# Patient Record
Sex: Male | Born: 1985
Health system: Southern US, Community
[De-identification: ages and names within clinical notes are randomized; demographics above are authoritative.]

## PROBLEM LIST (undated history)

## (undated) DIAGNOSIS — E785 Hyperlipidemia, unspecified: Secondary | ICD-10-CM

## (undated) DIAGNOSIS — R740 Nonspecific elevation of levels of transaminase and lactic acid dehydrogenase [LDH]: Principal | ICD-10-CM

## (undated) DIAGNOSIS — K824 Cholesterolosis of gallbladder: Secondary | ICD-10-CM

## (undated) DIAGNOSIS — R7401 Elevation of levels of liver transaminase levels: Secondary | ICD-10-CM

## (undated) HISTORY — DX: Elevation of levels of liver transaminase levels: R74.01

## (undated) HISTORY — DX: Cholesterolosis of gallbladder: K82.4

## (undated) HISTORY — DX: Nonspecific elevation of levels of transaminase and lactic acid dehydrogenase (ldh): R74.0

## (undated) HISTORY — PX: WISDOM TOOTH EXTRACTION: SHX21

## (undated) HISTORY — DX: Hyperlipidemia, unspecified: E78.5

---

## 2008-12-08 ENCOUNTER — Emergency Department (HOSPITAL_COMMUNITY): Admission: EM | Admit: 2008-12-08 | Discharge: 2008-12-08 | Payer: Self-pay | Admitting: Family Medicine

## 2015-11-27 ENCOUNTER — Telehealth: Payer: Self-pay | Admitting: *Deleted

## 2015-11-27 ENCOUNTER — Encounter: Payer: Self-pay | Admitting: *Deleted

## 2015-11-27 NOTE — Telephone Encounter (Signed)
Pre-Visit Call completed with patient and chart updated.   Pre-Visit Info documented in Specialty Comments under [employer].    

## 2015-11-27 NOTE — Telephone Encounter (Signed)
Unable to reach patient at time of pre-visit call. Left message for patient to return call when available.  

## 2015-11-28 ENCOUNTER — Encounter: Payer: Self-pay | Admitting: Physician Assistant

## 2015-11-28 ENCOUNTER — Ambulatory Visit (INDEPENDENT_AMBULATORY_CARE_PROVIDER_SITE_OTHER): Payer: BLUE CROSS/BLUE SHIELD | Admitting: Physician Assistant

## 2015-11-28 VITALS — BP 110/72 | HR 61 | Temp 98.4°F | Ht 71.0 in | Wt 173.4 lb

## 2015-11-28 DIAGNOSIS — M25511 Pain in right shoulder: Secondary | ICD-10-CM

## 2015-11-28 DIAGNOSIS — Z0000 Encounter for general adult medical examination without abnormal findings: Secondary | ICD-10-CM | POA: Insufficient documentation

## 2015-11-28 DIAGNOSIS — G8929 Other chronic pain: Secondary | ICD-10-CM | POA: Diagnosis not present

## 2015-11-28 LAB — CBC
HEMATOCRIT: 45.2 % (ref 39.0–52.0)
Hemoglobin: 15.6 g/dL (ref 13.0–17.0)
MCHC: 34.6 g/dL (ref 30.0–36.0)
MCV: 83.7 fl (ref 78.0–100.0)
Platelets: 221 10*3/uL (ref 150.0–400.0)
RBC: 5.39 Mil/uL (ref 4.22–5.81)
RDW: 12.8 % (ref 11.5–15.5)
WBC: 7.3 10*3/uL (ref 4.0–10.5)

## 2015-11-28 LAB — LIPID PANEL
CHOLESTEROL: 192 mg/dL (ref 0–200)
HDL: 31.8 mg/dL — AB (ref >39.00)
LDL CALC: 127 mg/dL — AB (ref 0–99)
NonHDL: 159.77
TRIGLYCERIDES: 165 mg/dL — AB (ref 0.0–149.0)
Total CHOL/HDL Ratio: 6
VLDL: 33 mg/dL (ref 0.0–40.0)

## 2015-11-28 LAB — URINALYSIS, ROUTINE W REFLEX MICROSCOPIC
Bilirubin Urine: NEGATIVE
Hgb urine dipstick: NEGATIVE
KETONES UR: NEGATIVE
Leukocytes, UA: NEGATIVE
NITRITE: NEGATIVE
SPECIFIC GRAVITY, URINE: 1.01 (ref 1.000–1.030)
Total Protein, Urine: NEGATIVE
Urine Glucose: NEGATIVE
Urobilinogen, UA: 0.2 (ref 0.0–1.0)
pH: 7 (ref 5.0–8.0)

## 2015-11-28 LAB — COMPREHENSIVE METABOLIC PANEL
ALBUMIN: 4.7 g/dL (ref 3.5–5.2)
ALT: 63 U/L — ABNORMAL HIGH (ref 0–53)
AST: 31 U/L (ref 0–37)
Alkaline Phosphatase: 62 U/L (ref 39–117)
BUN: 13 mg/dL (ref 6–23)
CALCIUM: 9.9 mg/dL (ref 8.4–10.5)
CHLORIDE: 102 meq/L (ref 96–112)
CO2: 31 mEq/L (ref 19–32)
Creatinine, Ser: 1.07 mg/dL (ref 0.40–1.50)
GFR: 87.6 mL/min (ref >60.00)
Glucose, Bld: 95 mg/dL (ref 70–99)
POTASSIUM: 3.7 meq/L (ref 3.5–5.1)
Sodium: 138 mEq/L (ref 135–145)
Total Bilirubin: 0.8 mg/dL (ref 0.2–1.2)
Total Protein: 7.8 g/dL (ref 6.0–8.3)

## 2015-11-28 LAB — TSH: TSH: 4.03 u[IU]/mL (ref 0.35–4.50)

## 2015-11-28 LAB — HEMOGLOBIN A1C: HEMOGLOBIN A1C: 5 % (ref 4.6–6.5)

## 2015-11-28 NOTE — Patient Instructions (Signed)
Please go to the lab for blood work.   Our office will call you with your results unless you have chosen to receive results via MyChart.  If your blood work is normal we will follow-up each year for physicals and as scheduled for chronic medical problems.  If anything is abnormal we will treat accordingly and get you in for a follow-up.  You will be contacted for assessment by Orthopedics. If you do not hear from them by early next week, please give me a call.  Preventive Care for Adults, Male A healthy lifestyle and preventive care can promote health and wellness. Preventive health guidelines for men include the following key practices:  A routine yearly physical is a good way to check with your health care provider about your health and preventative screening. It is a chance to share any concerns and updates on your health and to receive a thorough exam.  Visit your dentist for a routine exam and preventative care every 6 months. Brush your teeth twice a day and floss once a day. Good oral hygiene prevents tooth decay and gum disease.  The frequency of eye exams is based on your age, health, family medical history, use of contact lenses, and other factors. Follow your health care provider's recommendations for frequency of eye exams.  Eat a healthy diet. Foods such as vegetables, fruits, whole grains, low-fat dairy products, and lean protein foods contain the nutrients you need without too many calories. Decrease your intake of foods high in solid fats, added sugars, and salt. Eat the right amount of calories for you.Get information about a proper diet from your health care provider, if necessary.  Regular physical exercise is one of the most important things you can do for your health. Most adults should get at least 150 minutes of moderate-intensity exercise (any activity that increases your heart rate and causes you to sweat) each week. In addition, most adults need muscle-strengthening  exercises on 2 or more days a week.  Maintain a healthy weight. The body mass index (BMI) is a screening tool to identify possible weight problems. It provides an estimate of body fat based on height and weight. Your health care provider can find your BMI and can help you achieve or maintain a healthy weight.For adults 20 years and older:  A BMI below 18.5 is considered underweight.  A BMI of 18.5 to 24.9 is normal.  A BMI of 25 to 29.9 is considered overweight.  A BMI of 30 and above is considered obese.  Maintain normal blood lipids and cholesterol levels by exercising and minimizing your intake of saturated fat. Eat a balanced diet with plenty of fruit and vegetables. Blood tests for lipids and cholesterol should begin at age 80 and be repeated every 5 years. If your lipid or cholesterol levels are high, you are over 50, or you are at high risk for heart disease, you may need your cholesterol levels checked more frequently.Ongoing high lipid and cholesterol levels should be treated with medicines if diet and exercise are not working.  If you smoke, find out from your health care provider how to quit. If you do not use tobacco, do not start.  Lung cancer screening is recommended for adults aged 14-80 years who are at high risk for developing lung cancer because of a history of smoking. A yearly low-dose CT scan of the lungs is recommended for people who have at least a 30-pack-year history of smoking and are a current smoker or  have quit within the past 15 years. A pack year of smoking is smoking an average of 1 pack of cigarettes a day for 1 year (for example: 1 pack a day for 30 years or 2 packs a day for 15 years). Yearly screening should continue until the smoker has stopped smoking for at least 15 years. Yearly screening should be stopped for people who develop a health problem that would prevent them from having lung cancer treatment.  If you choose to drink alcohol, do not have more than  2 drinks per day. One drink is considered to be 12 ounces (355 mL) of beer, 5 ounces (148 mL) of wine, or 1.5 ounces (44 mL) of liquor.  Avoid use of street drugs. Do not share needles with anyone. Ask for help if you need support or instructions about stopping the use of drugs.  High blood pressure causes heart disease and increases the risk of stroke. Your blood pressure should be checked at least every 1-2 years. Ongoing high blood pressure should be treated with medicines, if weight loss and exercise are not effective.  If you are 75-19 years old, ask your health care provider if you should take aspirin to prevent heart disease.  Diabetes screening is done by taking a blood sample to check your blood glucose level after you have not eaten for a certain period of time (fasting). If you are not overweight and you do not have risk factors for diabetes, you should be screened once every 3 years starting at age 64. If you are overweight or obese and you are 67-27 years of age, you should be screened for diabetes every year as part of your cardiovascular risk assessment.  Colorectal cancer can be detected and often prevented. Most routine colorectal cancer screening begins at the age of 54 and continues through age 42. However, your health care provider may recommend screening at an earlier age if you have risk factors for colon cancer. On a yearly basis, your health care provider may provide home test kits to check for hidden blood in the stool. Use of a small camera at the end of a tube to directly examine the colon (sigmoidoscopy or colonoscopy) can detect the earliest forms of colorectal cancer. Talk to your health care provider about this at age 35, when routine screening begins. Direct exam of the colon should be repeated every 5-10 years through age 44, unless early forms of precancerous polyps or small growths are found.  People who are at an increased risk for hepatitis B should be screened for  this virus. You are considered at high risk for hepatitis B if:  You were born in a country where hepatitis B occurs often. Talk with your health care provider about which countries are considered high risk.  Your parents were born in a high-risk country and you have not received a shot to protect against hepatitis B (hepatitis B vaccine).  You have HIV or AIDS.  You use needles to inject street drugs.  You live with, or have sex with, someone who has hepatitis B.  You are a man who has sex with other men (MSM).  You get hemodialysis treatment.  You take certain medicines for conditions such as cancer, organ transplantation, and autoimmune conditions.  Hepatitis C blood testing is recommended for all people born from [birth year] through 1965 and any individual with known risks for hepatitis C.  Practice safe sex. Use condoms and avoid high-risk sexual practices to reduce the spread  of sexually transmitted infections (STIs). STIs include gonorrhea, chlamydia, syphilis, trichomonas, herpes, HPV, and human immunodeficiency virus (HIV). Herpes, HIV, and HPV are viral illnesses that have no cure. They can result in disability, cancer, and death.  If you are a man who has sex with other men, you should be screened at least once per year for:  HIV.  Urethral, rectal, and pharyngeal infection of gonorrhea, chlamydia, or both.  If you are at risk of being infected with HIV, it is recommended that you take a prescription medicine daily to prevent HIV infection. This is called preexposure prophylaxis (PrEP). You are considered at risk if:  You are a man who has sex with other men (MSM) and have other risk factors.  You are a heterosexual man, are sexually active, and are at increased risk for HIV infection.  You take drugs by injection.  You are sexually active with a partner who has HIV.  Talk with your health care provider about whether you are at high risk of being infected with HIV. If you  choose to begin PrEP, you should first be tested for HIV. You should then be tested every 3 months for as long as you are taking PrEP.  A one-time screening for abdominal aortic aneurysm (AAA) and surgical repair of large AAAs by ultrasound are recommended for men ages 69 to 73 years who are current or former smokers.  Healthy men should no longer receive prostate-specific antigen (PSA) blood tests as part of routine cancer screening. Talk with your health care provider about prostate cancer screening.  Testicular cancer screening is not recommended for adult males who have no symptoms. Screening includes self-exam, a health care provider exam, and other screening tests. Consult with your health care provider about any symptoms you have or any concerns you have about testicular cancer.  Use sunscreen. Apply sunscreen liberally and repeatedly throughout the day. You should seek shade when your shadow is shorter than you. Protect yourself by wearing long sleeves, pants, a wide-brimmed hat, and sunglasses year round, whenever you are outdoors.  Once a month, do a whole-body skin exam, using a mirror to look at the skin on your back. Tell your health care provider about new moles, moles that have irregular borders, moles that are larger than a pencil eraser, or moles that have changed in shape or color.  Stay current with required vaccines (immunizations).  Influenza vaccine. All adults should be immunized every year.  Tetanus, diphtheria, and acellular pertussis (Td, Tdap) vaccine. An adult who has not previously received Tdap or who does not know his vaccine status should receive 1 dose of Tdap. This initial dose should be followed by tetanus and diphtheria toxoids (Td) booster doses every 10 years. Adults with an unknown or incomplete history of completing a 3-dose immunization series with Td-containing vaccines should begin or complete a primary immunization series including a Tdap dose. Adults  should receive a Td booster every 10 years.  Varicella vaccine. An adult without evidence of immunity to varicella should receive 2 doses or a second dose if he has previously received 1 dose.  Human papillomavirus (HPV) vaccine. Males aged 11-21 years who have not received the vaccine previously should receive the 3-dose series. Males aged 22-26 years may be immunized. Immunization is recommended through the age of 17 years for any male who has sex with males and did not get any or all doses earlier. Immunization is recommended for any person with an immunocompromised condition through the age  of 26 years if he did not get any or all doses earlier. During the 3-dose series, the second dose should be obtained 4-8 weeks after the first dose. The third dose should be obtained 24 weeks after the first dose and 16 weeks after the second dose.  Zoster vaccine. One dose is recommended for adults aged 38 years or older unless certain conditions are present.  Measles, mumps, and rubella (MMR) vaccine. Adults born before [birth year] generally are considered immune to measles and mumps. Adults born in [birth year] or later should have 1 or more doses of MMR vaccine unless there is a contraindication to the vaccine or there is laboratory evidence of immunity to each of the three diseases. A routine second dose of MMR vaccine should be obtained at least 28 days after the first dose for students attending postsecondary schools, health care workers, or international travelers. People who received inactivated measles vaccine or an unknown type of measles vaccine during 1963-1967 should receive 2 doses of MMR vaccine. People who received inactivated mumps vaccine or an unknown type of mumps vaccine before 1979 and are at high risk for mumps infection should consider immunization with 2 doses of MMR vaccine. Unvaccinated health care workers born before [birth year] who lack laboratory evidence of measles, mumps, or rubella immunity or laboratory  confirmation of disease should consider measles and mumps immunization with 2 doses of MMR vaccine or rubella immunization with 1 dose of MMR vaccine.  Pneumococcal 13-valent conjugate (PCV13) vaccine. When indicated, a person who is uncertain of his immunization history and has no record of immunization should receive the PCV13 vaccine. All adults 76 years of age and older should receive this vaccine. An adult aged 75 years or older who has certain medical conditions and has not been previously immunized should receive 1 dose of PCV13 vaccine. This PCV13 should be followed with a dose of pneumococcal polysaccharide (PPSV23) vaccine. Adults who are at high risk for pneumococcal disease should obtain the PPSV23 vaccine at least 8 weeks after the dose of PCV13 vaccine. Adults older than 28 years of age who have normal immune system function should obtain the PPSV23 vaccine dose at least 1 year after the dose of PCV13 vaccine.  Pneumococcal polysaccharide (PPSV23) vaccine. When PCV13 is also indicated, PCV13 should be obtained first. All adults aged 47 years and older should be immunized. An adult younger than age 10 years who has certain medical conditions should be immunized. Any person who resides in a nursing home or long-term care facility should be immunized. An adult smoker should be immunized. People with an immunocompromised condition and certain other conditions should receive both PCV13 and PPSV23 vaccines. People with human immunodeficiency virus (HIV) infection should be immunized as soon as possible after diagnosis. Immunization during chemotherapy or radiation therapy should be avoided. Routine use of PPSV23 vaccine is not recommended for American Indians, [place] Natives, or people younger than 65 years unless there are medical conditions that require PPSV23 vaccine. When indicated, people who have unknown immunization and have no record of immunization should receive PPSV23 vaccine. One-time  revaccination 5 years after the first dose of PPSV23 is recommended for people aged 19-64 years who have chronic kidney failure, nephrotic syndrome, asplenia, or immunocompromised conditions. People who received 1-2 doses of PPSV23 before age 49 years should receive another dose of PPSV23 vaccine at age 18 years or later if at least 5 years have passed since the previous dose. Doses of PPSV23 are not needed for people immunized  with PPSV23 at or after age 18 years.  Meningococcal vaccine. Adults with asplenia or persistent complement component deficiencies should receive 2 doses of quadrivalent meningococcal conjugate (MenACWY-D) vaccine. The doses should be obtained at least 2 months apart. Microbiologists working with certain meningococcal bacteria, [place] recruits, people at risk during an outbreak, and people who travel to or live in countries with a high rate of meningitis should be immunized. A first-year college student up through age 21 years who is living in a residence hall should receive a dose if he did not receive a dose on or after his 16th birthday. Adults who have certain high-risk conditions should receive one or more doses of vaccine.  Hepatitis A vaccine. Adults who wish to be protected from this disease, have chronic liver disease, work with hepatitis A-infected animals, work in hepatitis A research labs, or travel to or work in countries with a high rate of hepatitis A should be immunized. Adults who were previously unvaccinated and who anticipate close contact with an international adoptee during the first 60 days after arrival in the [place] from a country with a high rate of hepatitis A should be immunized.  Hepatitis B vaccine. Adults should be immunized if they wish to be protected from this disease, are under age 1 years and have diabetes, have chronic liver disease, have had more than one sex partner in the past 6 months, may be exposed to blood or other infectious body  fluids, are household contacts or sex partners of hepatitis B positive people, are clients or workers in certain care facilities, or travel to or work in countries with a high rate of hepatitis B.  Haemophilus influenzae type b (Hib) vaccine. A previously unvaccinated person with asplenia or sickle cell disease or having a scheduled splenectomy should receive 1 dose of Hib vaccine. Regardless of previous immunization, a recipient of a hematopoietic stem cell transplant should receive a 3-dose series 6-12 months after his successful transplant. Hib vaccine is not recommended for adults with HIV infection. Preventive Service / Frequency Ages 64 to 69  Blood pressure check.** / Every 3-5 years.  Lipid and cholesterol check.** / Every 5 years beginning at age 17.  Hepatitis C blood test.** / For any individual with known risks for hepatitis C.  Skin self-exam. / Monthly.  Influenza vaccine. / Every year.  Tetanus, diphtheria, and acellular pertussis (Tdap, Td) vaccine.** / Consult your health care provider. 1 dose of Td every 10 years.  Varicella vaccine.** / Consult your health care provider.  HPV vaccine. / 3 doses over 6 months, if 73 or younger.  Measles, mumps, rubella (MMR) vaccine.** / You need at least 1 dose of MMR if you were born in 1957 or later. You may also need a second dose.  Pneumococcal 13-valent conjugate (PCV13) vaccine.** / Consult your health care provider.  Pneumococcal polysaccharide (PPSV23) vaccine.** / 1 to 2 doses if you smoke cigarettes or if you have certain conditions.  Meningococcal vaccine.** / 1 dose if you are age 69 to 52 years and a Market researcher living in a residence hall, or have one of several medical conditions. You may also need additional booster doses.  Hepatitis A vaccine.** / Consult your health care provider.  Hepatitis B vaccine.** / Consult your health care provider.  Haemophilus influenzae type b (Hib) vaccine.** / Consult  your health care provider. Ages 50 to 67  Blood pressure check.** / Every year.  Lipid and cholesterol check.** / Every 5  years beginning at age 94.  Lung cancer screening. / Every year if you are aged 40-80 years and have a 30-pack-year history of smoking and currently smoke or have quit within the past 15 years. Yearly screening is stopped once you have quit smoking for at least 15 years or develop a health problem that would prevent you from having lung cancer treatment.  Fecal occult blood test (FOBT) of stool. / Every year beginning at age 79 and continuing until age 59. You may not have to do this test if you get a colonoscopy every 10 years.  Flexible sigmoidoscopy** or colonoscopy.** / Every 5 years for a flexible sigmoidoscopy or every 10 years for a colonoscopy beginning at age 61 and continuing until age 73.  Hepatitis C blood test.** / For all people born from [birth year] through 1965 and any individual with known risks for hepatitis C.  Skin self-exam. / Monthly.  Influenza vaccine. / Every year.  Tetanus, diphtheria, and acellular pertussis (Tdap/Td) vaccine.** / Consult your health care provider. 1 dose of Td every 10 years.  Varicella vaccine.** / Consult your health care provider.  Zoster vaccine.** / 1 dose for adults aged 5 years or older.  Measles, mumps, rubella (MMR) vaccine.** / You need at least 1 dose of MMR if you were born in 1957 or later. You may also need a second dose.  Pneumococcal 13-valent conjugate (PCV13) vaccine.** / Consult your health care provider.  Pneumococcal polysaccharide (PPSV23) vaccine.** / 1 to 2 doses if you smoke cigarettes or if you have certain conditions.  Meningococcal vaccine.** / Consult your health care provider.  Hepatitis A vaccine.** / Consult your health care provider.  Hepatitis B vaccine.** / Consult your health care provider.  Haemophilus influenzae type b (Hib) vaccine.** / Consult your health care provider. Ages 29 and  over  Blood pressure check.** / Every year.  Lipid and cholesterol check.**/ Every 5 years beginning at age 66.  Lung cancer screening. / Every year if you are aged 70-80 years and have a 30-pack-year history of smoking and currently smoke or have quit within the past 15 years. Yearly screening is stopped once you have quit smoking for at least 15 years or develop a health problem that would prevent you from having lung cancer treatment.  Fecal occult blood test (FOBT) of stool. / Every year beginning at age 1 and continuing until age 41. You may not have to do this test if you get a colonoscopy every 10 years.  Flexible sigmoidoscopy** or colonoscopy.** / Every 5 years for a flexible sigmoidoscopy or every 10 years for a colonoscopy beginning at age 74 and continuing until age 62.  Hepatitis C blood test.** / For all people born from [birth year] through 1963 and any individual with known risks for hepatitis C.  Abdominal aortic aneurysm (AAA) screening.** / A one-time screening for ages 68 to 65 years who are current or former smokers.  Skin self-exam. / Monthly.  Influenza vaccine. / Every year.  Tetanus, diphtheria, and acellular pertussis (Tdap/Td) vaccine.** / 1 dose of Td every 10 years.  Varicella vaccine.** / Consult your health care provider.  Zoster vaccine.** / 1 dose for adults aged 37 years or older.  Pneumococcal 13-valent conjugate (PCV13) vaccine.** / 1 dose for all adults aged 31 years and older.  Pneumococcal polysaccharide (PPSV23) vaccine.** / 1 dose for all adults aged 67 years and older.  Meningococcal vaccine.** / Consult your health care provider.  Hepatitis A vaccine.** / Consult  your health care provider.  Hepatitis B vaccine.** / Consult your health care provider.  Haemophilus influenzae type b (Hib) vaccine.** / Consult your health care provider. **Family history and personal history of risk and conditions may change your health care provider's  recommendations.   This information is not intended to replace advice given to you by your health care provider. Make sure you discuss any questions you have with your health care provider.   Document Released: 09/14/2001 Document Revised: 08/09/2014 Document Reviewed: 12/14/2010 Elsevier Interactive Patient Education [employer].

## 2015-11-28 NOTE — Assessment & Plan Note (Signed)
Exam within normal limits today. History of mild thoracic outlet syndrome. Referral to Orthopedics placed for further assessment and management.

## 2015-11-28 NOTE — Progress Notes (Signed)
Patient presents to clinic today to establish care.  Acute Concerns: Patient c/o pain in R shoulder first noticed 2012. Previously participated in competitive baseball. Patient has history of thoracic outlet syndrome. Pain is described as uncomfortable.. Endorses intermittent numbness and tingling in the arm. Denies trauma or injury to the neck. Denies neck pain. Denies pain or discomfort of L shoulder.  Health Maintenance: Immunizations -- Tetanus up-to-date. HIV -- Previously negative test. 2010.  Past Medical History  Diagnosis Date  . Hyperlipidemia     Past Surgical History  Procedure Laterality Date  . Wisdom tooth extraction      No current outpatient prescriptions on file prior to visit.   No current facility-administered medications on file prior to visit.    No Known Allergies  Family History  Problem Relation Age of Onset  . Hypertension Mother   . Hyperlipidemia Father     Social History   Social History  . Marital Status: Married    Spouse Name: N/A  . Number of Children: 0  . Years of Education: N/A   Occupational History  . Sales    Social History Main Topics  . Smoking status: Never Smoker   . Smokeless tobacco: Never Used  . Alcohol Use: 0.0 oz/week    0 Standard drinks or equivalent per week     Comment: rare  . Drug Use: No  . Sexual Activity: Not on file   Other Topics Concern  . Not on file   Social History Narrative   Review of Systems  Constitutional: Negative for fever and weight loss.  HENT: Negative for ear discharge, ear pain, hearing loss and tinnitus.   Eyes: Negative for blurred vision, double vision, photophobia and pain.  Respiratory: Negative for cough and shortness of breath.   Cardiovascular: Negative for chest pain and palpitations.  Gastrointestinal: Negative for heartburn, nausea, vomiting, abdominal pain, diarrhea, constipation, blood in stool and melena.  Genitourinary: Negative for dysuria, urgency,  frequency, hematuria and flank pain.  Musculoskeletal: Positive for joint pain. Negative for falls.  Neurological: Negative for dizziness, loss of consciousness and headaches.  Endo/Heme/Allergies: Negative for environmental allergies.  Psychiatric/Behavioral: Negative for depression, suicidal ideas, hallucinations and substance abuse. The patient is not nervous/anxious and does not have insomnia.     BP 110/72 mmHg  Pulse 61  Temp(Src) 98.4 F (36.9 C) (Oral)  Ht  (1.803 m)  Wt 173 lb 6 oz (78.642 kg)  BMI 24.19 kg/m2  SpO2 97%  Physical Exam  Constitutional: He is oriented to person, place, and time and well-developed, well-nourished, and in no distress.  HENT:  Head: Normocephalic and atraumatic.  Right Ear: External ear normal.  Left Ear: External ear normal.  Nose: Nose normal.  Mouth/Throat: Oropharynx is clear and moist. No oropharyngeal exudate.  Eyes: Conjunctivae and EOM are normal. Pupils are equal, round, and reactive to light.  Neck: Neck supple. No thyromegaly present.  Cardiovascular: Normal rate, regular rhythm, normal heart sounds and intact distal pulses.   Pulmonary/Chest: Effort normal and breath sounds normal. No respiratory distress. He has no wheezes. He has no rales. He exhibits no tenderness.  Abdominal: Soft. Bowel sounds are normal. He exhibits no distension and no mass. There is no tenderness. There is no rebound and no guarding.  Genitourinary: Testes/scrotum normal and penis normal. No discharge found.  Musculoskeletal:       Right shoulder: He exhibits normal range of motion, no tenderness, no pain, no spasm, normal pulse and normal  strength.  Lymphadenopathy:    He has no cervical adenopathy.  Neurological: He is alert and oriented to person, place, and time.  Skin: Skin is warm and dry. No rash noted.  Psychiatric: Affect normal.  Vitals reviewed.  Assessment/Plan: Visit for preventive health examination Depression screen  negative. Health Maintenance reviewed -- HIV screen previously completed. TDaP up-to-date. Preventive schedule discussed and handout given in AVS. Will obtain fasting labs today.   Chronic right shoulder pain Exam within normal limits today. History of mild thoracic outlet syndrome. Referral to Orthopedics placed for further assessment and management.

## 2015-11-28 NOTE — Progress Notes (Signed)
Pre visit review using our clinic review tool, if applicable. No additional management support is needed unless otherwise documented below in the visit note. 

## 2015-11-28 NOTE — Assessment & Plan Note (Signed)
Depression screen negative. Health Maintenance reviewed -- HIV screen previously completed. TDaP up-to-date. Preventive schedule discussed and handout given in AVS. Will obtain fasting labs today.

## 2015-12-01 ENCOUNTER — Telehealth: Payer: Self-pay | Admitting: *Deleted

## 2015-12-01 DIAGNOSIS — R7989 Other specified abnormal findings of blood chemistry: Secondary | ICD-10-CM

## 2015-12-01 DIAGNOSIS — R945 Abnormal results of liver function studies: Secondary | ICD-10-CM

## 2015-12-01 DIAGNOSIS — R8290 Unspecified abnormal findings in urine: Secondary | ICD-10-CM

## 2015-12-01 NOTE — Telephone Encounter (Signed)
-----   Message from Waldon MerlWilliam C Martin, PA-C sent at 12/01/2015  8:03 AM EDT ----- Lab results are in. Overall things look great.  (1) Blood count, A1C (marker for diabetes) and tyroid function are all normal. (2) Electrolytes and kidney function look good.  (3) liver function looks good overall but one enzyme (ALT) is mildly elevated. Make sure he limits use of tylenol-containing products. Limit alcohol. Would like to recheck LFTs in 1 month to make sure this level improves or is unchanged. Most of the time this mild elevation is due to tylenol use of what we call fatty liver which is fat deposition around the liver itself.  (4) Cholesterol is borderline elevated with LDL at 127. No need for medication at present. Increase exercise. Start diet low in saturated fats and cholesterol. (5) Urine shows some mucous and epithelial cells which should not be present. Do not think there is anything to worry about but would like him to return to lab later this week if possible, well-hydrated, to give a repeat urine sample for further assessment.

## 2015-12-01 NOTE — Telephone Encounter (Signed)
Patient informed, understood & agreed; he will be in tomorrow morning and ask for me for repeat urine sample; new Lab orders placed/SLS 05/01

## 2015-12-02 ENCOUNTER — Other Ambulatory Visit (INDEPENDENT_AMBULATORY_CARE_PROVIDER_SITE_OTHER): Payer: BLUE CROSS/BLUE SHIELD

## 2015-12-02 DIAGNOSIS — R8290 Unspecified abnormal findings in urine: Secondary | ICD-10-CM

## 2015-12-02 LAB — URINALYSIS, ROUTINE W REFLEX MICROSCOPIC
Bilirubin Urine: NEGATIVE
Hgb urine dipstick: NEGATIVE
KETONES UR: NEGATIVE
Leukocytes, UA: NEGATIVE
Nitrite: NEGATIVE
PH: 6 (ref 5.0–8.0)
RBC / HPF: NONE SEEN (ref 0–?)
SPECIFIC GRAVITY, URINE: 1.02 (ref 1.000–1.030)
Total Protein, Urine: NEGATIVE
URINE GLUCOSE: NEGATIVE
Urobilinogen, UA: 0.2 (ref 0.0–1.0)
WBC, UA: NONE SEEN (ref 0–?)

## 2015-12-08 DIAGNOSIS — M79601 Pain in right arm: Secondary | ICD-10-CM | POA: Diagnosis not present

## 2016-01-22 DIAGNOSIS — M79601 Pain in right arm: Secondary | ICD-10-CM | POA: Diagnosis not present

## 2016-01-29 DIAGNOSIS — M79601 Pain in right arm: Secondary | ICD-10-CM | POA: Diagnosis not present

## 2016-02-05 DIAGNOSIS — M79601 Pain in right arm: Secondary | ICD-10-CM | POA: Diagnosis not present

## 2016-02-09 ENCOUNTER — Telehealth: Payer: Self-pay | Admitting: Physician Assistant

## 2016-02-09 NOTE — Telephone Encounter (Signed)
[facility] 3 pages to Dr. Marcelline MatesWilliam Martin

## 2016-02-10 DIAGNOSIS — M79601 Pain in right arm: Secondary | ICD-10-CM | POA: Diagnosis not present

## 2016-02-11 DIAGNOSIS — M79601 Pain in right arm: Secondary | ICD-10-CM | POA: Diagnosis not present

## 2017-10-28 ENCOUNTER — Ambulatory Visit (INDEPENDENT_AMBULATORY_CARE_PROVIDER_SITE_OTHER): Payer: BLUE CROSS/BLUE SHIELD | Admitting: Physician Assistant

## 2017-10-28 ENCOUNTER — Encounter: Payer: Self-pay | Admitting: Physician Assistant

## 2017-10-28 ENCOUNTER — Other Ambulatory Visit: Payer: Self-pay

## 2017-10-28 VITALS — BP 110/73 | HR 76 | Temp 98.0°F | Resp 16 | Ht 71.0 in | Wt 174.5 lb

## 2017-10-28 DIAGNOSIS — Z0001 Encounter for general adult medical examination with abnormal findings: Secondary | ICD-10-CM | POA: Diagnosis not present

## 2017-10-28 DIAGNOSIS — Z0000 Encounter for general adult medical examination without abnormal findings: Secondary | ICD-10-CM

## 2017-10-28 DIAGNOSIS — Z23 Encounter for immunization: Secondary | ICD-10-CM

## 2017-10-28 DIAGNOSIS — L259 Unspecified contact dermatitis, unspecified cause: Secondary | ICD-10-CM | POA: Diagnosis not present

## 2017-10-28 LAB — COMPREHENSIVE METABOLIC PANEL
ALK PHOS: 62 U/L (ref 39–117)
ALT: 58 U/L — AB (ref 0–53)
AST: 28 U/L (ref 0–37)
Albumin: 4.5 g/dL (ref 3.5–5.2)
BILIRUBIN TOTAL: 0.6 mg/dL (ref 0.2–1.2)
BUN: 15 mg/dL (ref 6–23)
CO2: 27 mEq/L (ref 19–32)
Calcium: 9.8 mg/dL (ref 8.4–10.5)
Chloride: 103 mEq/L (ref 96–112)
Creatinine, Ser: 0.97 mg/dL (ref 0.40–1.50)
GFR: 96.78 mL/min (ref >60.00)
GLUCOSE: 107 mg/dL — AB (ref 70–99)
POTASSIUM: 4 meq/L (ref 3.5–5.1)
Sodium: 138 mEq/L (ref 135–145)
TOTAL PROTEIN: 7.4 g/dL (ref 6.0–8.3)

## 2017-10-28 LAB — LIPID PANEL
Cholesterol: 210 mg/dL — ABNORMAL HIGH (ref 0–200)
HDL: 31.3 mg/dL — AB (ref >39.00)
LDL Cholesterol: 144 mg/dL — ABNORMAL HIGH (ref 0–99)
NonHDL: 178.55
Total CHOL/HDL Ratio: 7
Triglycerides: 172 mg/dL — ABNORMAL HIGH (ref 0.0–149.0)
VLDL: 34.4 mg/dL (ref 0.0–40.0)

## 2017-10-28 LAB — CBC WITH DIFFERENTIAL/PLATELET
Basophils Absolute: 0 10*3/uL (ref 0.0–0.1)
Basophils Relative: 0.5 % (ref 0.0–3.0)
EOS PCT: 2.9 % (ref 0.0–5.0)
Eosinophils Absolute: 0.2 10*3/uL (ref 0.0–0.7)
HCT: 43.6 % (ref 39.0–52.0)
Hemoglobin: 15.5 g/dL (ref 13.0–17.0)
LYMPHS ABS: 2.1 10*3/uL (ref 0.7–4.0)
Lymphocytes Relative: 26.9 % (ref 12.0–46.0)
MCHC: 35.5 g/dL (ref 30.0–36.0)
MCV: 83.6 fl (ref 78.0–100.0)
MONOS PCT: 11 % (ref 3.0–12.0)
Monocytes Absolute: 0.9 10*3/uL (ref 0.1–1.0)
NEUTROS ABS: 4.6 10*3/uL (ref 1.4–7.7)
NEUTROS PCT: 58.7 % (ref 43.0–77.0)
Platelets: 220 10*3/uL (ref 150.0–400.0)
RBC: 5.22 Mil/uL (ref 4.22–5.81)
RDW: 12.9 % (ref 11.5–15.5)
WBC: 7.8 10*3/uL (ref 4.0–10.5)

## 2017-10-28 MED ORDER — TRIAMCINOLONE ACETONIDE 0.5 % EX OINT: 1.0000 "application " | TOPICAL_OINTMENT | Freq: Two times a day (BID) | CUTANEOUS | 0 refills | Status: DC

## 2017-10-28 NOTE — Progress Notes (Signed)
Patient presents to clinic today for annual exam.  Patient is fasting for labs.  Acute Concerns: Patient endorses rash of posterior legs and anterior forearms x 2 weeks. Rash is not painful but is extremely pruritic. 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 Denies change to soaps/lotions or detergents. Has taken Hydrocortisone with no relief of symptoms. Has also tried Benadryl without improvement in rash.   Chronic Issues: Hyperlipidemia -- No current medication. Diet -- Endorses very healthy diet that is well-balanced. Exercise -- resistance training and some cardio. Body mass index is 24.34 kg/m.  Health Maintenance: Immunizations -- Declines flu shot. Is in need of TDaP as wife is expecting.   Past Medical History:  Diagnosis Date  . Hyperlipidemia     Past Surgical History:  Procedure Laterality Date  . WISDOM TOOTH EXTRACTION      No current outpatient medications on file prior to visit.   No current facility-administered medications on file prior to visit.     No Known Allergies  Family History  Problem Relation Age of Onset  . Hypertension Mother   . Hyperlipidemia Father     Social History   Socioeconomic History  . Marital status: Married    Spouse name: Not on file  . Number of children: 0  . Years of education: Not on file  . Highest education level: Not on file  Occupational History  . Occupation: Airline pilot  Social Needs  . Financial resource strain: Not on file  . Food insecurity:    Worry: Not on file    Inability: Not on file  . Transportation needs:    Medical: Not on file    Non-medical: Not on file  Tobacco Use  . Smoking status: Never Smoker  . Smokeless tobacco: Never Used  Substance and Sexual Activity  . Alcohol use: Yes    Alcohol/week: 0.0 oz    Comment: rare  . Drug use: No  . Sexual activity: Yes  Lifestyle  . Physical activity:    Days per week: Not on file    Minutes per session: Not on file    . Stress: Not on file  Relationships  . Social connections:    Talks on phone: Not on file    Gets together: Not on file    Attends religious service: Not on file    Active member of club or organization: Not on file    Attends meetings of clubs or organizations: Not on file    Relationship status: Not on file  . Intimate partner violence:    Fear of current or ex partner: Not on file    Emotionally abused: Not on file    Physically abused: Not on file    Forced sexual activity: Not on file  Other Topics Concern  . Not on file  Social History Narrative  . Not on file   Review of Systems  Constitutional: Negative for fever and weight loss.  HENT: Negative for ear discharge, ear pain, hearing loss and tinnitus.   Eyes: Negative for blurred vision, double vision, photophobia and pain.  Respiratory: Negative for cough and shortness of breath.   Cardiovascular: Negative for chest pain and palpitations.  Gastrointestinal: Negative for abdominal pain, blood in stool, constipation, diarrhea, heartburn, melena, nausea and vomiting.  Genitourinary: Negative for dysuria, flank pain, frequency, hematuria and urgency.  Musculoskeletal: Negative for falls.  Skin: Positive for itching and rash.  Neurological: Negative for dizziness, loss of  consciousness and headaches.  Endo/Heme/Allergies: Negative for environmental allergies.  Psychiatric/Behavioral: Negative for depression, hallucinations, substance abuse and suicidal ideas. The patient is not nervous/anxious and does not have insomnia.    BP 110/73   Pulse 76   Temp 98 F (36.7 C) (Oral)   Resp 16   Ht 5\' 11"  (1.803 m)   Wt 174 lb 8 oz (79.2 kg)   SpO2 98%   BMI 24.34 kg/m   Physical Exam  Constitutional: He is oriented to person, place, and time and well-developed, well-nourished, and in no distress.  HENT:  Head: Normocephalic and atraumatic.  Right Ear: External ear normal.  Left Ear: External ear normal.  Nose: Nose  normal.  Mouth/Throat: Oropharynx is clear and moist. No oropharyngeal exudate.  Eyes: Pupils are equal, round, and reactive to light. Conjunctivae and EOM are normal.  Neck: Neck supple. No thyromegaly present.  Cardiovascular: Normal rate, regular rhythm, normal heart sounds and intact distal pulses.  Pulmonary/Chest: Effort normal and breath sounds normal. No respiratory distress. He has no wheezes. He has no rales. He exhibits no tenderness.  Abdominal: Soft. Bowel sounds are normal. He exhibits no distension and no mass. There is no tenderness. There is no rebound and no guarding.  Genitourinary: Testes/scrotum normal.  Lymphadenopathy:    He has no cervical adenopathy.  Neurological: He is alert and oriented to person, place, and time.  Skin: Skin is warm and dry. No rash noted.  Psychiatric: Affect normal.  Vitals reviewed.  Assessment/Plan: Contact dermatitis Suspected rhus dermatitis. Areas are improving. Only extremity involvement. Start kenalog topically BID. Supportive measures and OTC medications reviewed. Follow-up if not improving.   Visit for preventive health examination Depression screen negative. Health Maintenance reviewed -- Declines flu shot. TDaP updated today. Preventive schedule discussed and handout given in AVS. Will obtain fasting labs today.     Piedad ClimesWilliam Cody Azariel Banik, PA-C

## 2017-10-28 NOTE — Assessment & Plan Note (Signed)
Depression screen negative. Health Maintenance reviewed. Declines flu shot. TDaP updated today. Preventive schedule discussed and handout given in AVS. Will obtain fasting labs today.  

## 2017-10-28 NOTE — Assessment & Plan Note (Signed)
Suspected rhus dermatitis. Areas are improving. Only extremity involvement. Start kenalog topically BID. Supportive measures and OTC medications reviewed. Follow-up if not improving.

## 2017-10-28 NOTE — Patient Instructions (Signed)
Please go to the lab for blood work.   Our office will call you with your results unless you have chosen to receive results via MyChart.  If your blood work is normal we will follow-up each year for physicals and as scheduled for chronic medical problems.  If anything is abnormal we will treat accordingly and get you in for a follow-up.  Please apply the Kenalog to the areas twice daily for up to 2 weeks. (I highly doubt you will need this long).  Keep skin clean and dry. Avoid scented and dyed soaps.  Get some OTC Witch Hazel astringent to help dry out and soothe the areas.  Call me or come see me if you note any new or worsening symptoms.    Preventive Care 18-39 Years, Male Preventive care refers to lifestyle choices and visits with your health care provider that can promote health and wellness. What does preventive care include?  A yearly physical exam. This is also called an annual well check.  Dental exams once or twice a year.  Routine eye exams. Ask your health care provider how often you should have your eyes checked.  Personal lifestyle choices, including: ? Daily care of your teeth and gums. ? Regular physical activity. ? Eating a healthy diet. ? Avoiding tobacco and drug use. ? Limiting alcohol use. ? Practicing safe sex. What happens during an annual well check? The services and screenings done by your health care provider during your annual well check will depend on your age, overall health, lifestyle risk factors, and family history of disease. Counseling Your health care provider may ask you questions about your:  Alcohol use.  Tobacco use.  Drug use.  Emotional well-being.  Home and relationship well-being.  Sexual activity.  Eating habits.  Work and work Statistician.  Screening You may have the following tests or measurements:  Height, weight, and BMI.  Blood pressure.  Lipid and cholesterol levels. These may be checked every 5 years  starting at age 62.  Diabetes screening. This is done by checking your blood sugar (glucose) after you have not eaten for a while (fasting).  Skin check.  Hepatitis C blood test.  Hepatitis B blood test.  Sexually transmitted disease (STD) testing.  Discuss your test results, treatment options, and if necessary, the need for more tests with your health care provider. Vaccines Your health care provider may recommend certain vaccines, such as:  Influenza vaccine. This is recommended every year.  Tetanus, diphtheria, and acellular pertussis (Tdap, Td) vaccine. You may need a Td booster every 10 years.  Varicella vaccine. You may need this if you have not been vaccinated.  HPV vaccine. If you are 74 or younger, you may need three doses over 6 months.  Measles, mumps, and rubella (MMR) vaccine. You may need at least one dose of MMR.You may also need a second dose.  Pneumococcal 13-valent conjugate (PCV13) vaccine. You may need this if you have certain conditions and have not been vaccinated.  Pneumococcal polysaccharide (PPSV23) vaccine. You may need one or two doses if you smoke cigarettes or if you have certain conditions.  Meningococcal vaccine. One dose is recommended if you are age 50-21 years and a first-year college student living in a residence hall, or if you have one of several medical conditions. You may also need additional booster doses.  Hepatitis A vaccine. You may need this if you have certain conditions or if you travel or work in places where you may be  exposed to hepatitis A.  Hepatitis B vaccine. You may need this if you have certain conditions or if you travel or work in places where you may be exposed to hepatitis B.  Haemophilus influenzae type b (Hib) vaccine. You may need this if you have certain risk factors.  Talk to your health care provider about which screenings and vaccines you need and how often you need them. This information is not intended to  replace advice given to you by your health care provider. Make sure you discuss any questions you have with your health care provider. Document Released: 09/14/2001 Document Revised: 04/07/2016 Document Reviewed: 05/20/2015 Elsevier Interactive Patient Education  [employer].

## 2017-11-01 ENCOUNTER — Other Ambulatory Visit (INDEPENDENT_AMBULATORY_CARE_PROVIDER_SITE_OTHER): Payer: BLUE CROSS/BLUE SHIELD

## 2017-11-01 DIAGNOSIS — R7309 Other abnormal glucose: Secondary | ICD-10-CM | POA: Diagnosis not present

## 2017-11-01 LAB — HEMOGLOBIN A1C: Hgb A1c MFr Bld: 4.9 % (ref 4.6–6.5)

## 2017-11-02 ENCOUNTER — Other Ambulatory Visit: Payer: Self-pay

## 2017-11-02 DIAGNOSIS — R945 Abnormal results of liver function studies: Principal | ICD-10-CM

## 2017-11-02 DIAGNOSIS — R7989 Other specified abnormal findings of blood chemistry: Secondary | ICD-10-CM

## 2017-12-02 ENCOUNTER — Other Ambulatory Visit (INDEPENDENT_AMBULATORY_CARE_PROVIDER_SITE_OTHER): Payer: BLUE CROSS/BLUE SHIELD

## 2017-12-02 DIAGNOSIS — R945 Abnormal results of liver function studies: Secondary | ICD-10-CM

## 2017-12-02 DIAGNOSIS — R7989 Other specified abnormal findings of blood chemistry: Secondary | ICD-10-CM

## 2017-12-02 LAB — HEPATIC FUNCTION PANEL
ALT: 54 U/L — AB (ref 0–53)
AST: 27 U/L (ref 0–37)
Albumin: 4.7 g/dL (ref 3.5–5.2)
Alkaline Phosphatase: 75 U/L (ref 39–117)
BILIRUBIN DIRECT: 0.1 mg/dL (ref 0.0–0.3)
Total Bilirubin: 0.6 mg/dL (ref 0.2–1.2)
Total Protein: 7.4 g/dL (ref 6.0–8.3)

## 2017-12-05 ENCOUNTER — Other Ambulatory Visit: Payer: Self-pay

## 2017-12-05 DIAGNOSIS — E7800 Pure hypercholesterolemia, unspecified: Secondary | ICD-10-CM

## 2017-12-05 DIAGNOSIS — R7989 Other specified abnormal findings of blood chemistry: Secondary | ICD-10-CM

## 2017-12-05 DIAGNOSIS — R945 Abnormal results of liver function studies: Secondary | ICD-10-CM

## 2018-06-05 ENCOUNTER — Other Ambulatory Visit (INDEPENDENT_AMBULATORY_CARE_PROVIDER_SITE_OTHER): Payer: BLUE CROSS/BLUE SHIELD

## 2018-06-05 DIAGNOSIS — R945 Abnormal results of liver function studies: Secondary | ICD-10-CM

## 2018-06-05 DIAGNOSIS — Z23 Encounter for immunization: Secondary | ICD-10-CM

## 2018-06-05 DIAGNOSIS — E7800 Pure hypercholesterolemia, unspecified: Secondary | ICD-10-CM

## 2018-06-05 DIAGNOSIS — R7989 Other specified abnormal findings of blood chemistry: Secondary | ICD-10-CM

## 2018-06-05 LAB — LIPID PANEL
CHOLESTEROL: 242 mg/dL — AB (ref 0–200)
HDL: 34.3 mg/dL — AB (ref >39.00)
LDL Cholesterol: 178 mg/dL — ABNORMAL HIGH (ref 0–99)
NonHDL: 207.63
TRIGLYCERIDES: 149 mg/dL (ref 0.0–149.0)
Total CHOL/HDL Ratio: 7
VLDL: 29.8 mg/dL (ref 0.0–40.0)

## 2018-06-05 LAB — HEPATIC FUNCTION PANEL
ALT: 90 U/L — AB (ref 0–53)
AST: 34 U/L (ref 0–37)
Albumin: 5 g/dL (ref 3.5–5.2)
Alkaline Phosphatase: 70 U/L (ref 39–117)
BILIRUBIN TOTAL: 0.6 mg/dL (ref 0.2–1.2)
Bilirubin, Direct: 0.1 mg/dL (ref 0.0–0.3)
TOTAL PROTEIN: 8 g/dL (ref 6.0–8.3)

## 2018-06-06 ENCOUNTER — Other Ambulatory Visit: Payer: Self-pay | Admitting: Physician Assistant

## 2018-06-06 DIAGNOSIS — R945 Abnormal results of liver function studies: Principal | ICD-10-CM

## 2018-06-06 DIAGNOSIS — R7989 Other specified abnormal findings of blood chemistry: Secondary | ICD-10-CM

## 2018-06-09 ENCOUNTER — Ambulatory Visit
Admission: RE | Admit: 2018-06-09 | Discharge: 2018-06-09 | Disposition: A | Discharge status: Home / Self Care | Payer: BLUE CROSS/BLUE SHIELD | Source: Ambulatory Visit | Attending: Physician Assistant | Admitting: Physician Assistant

## 2018-06-09 DIAGNOSIS — R7989 Other specified abnormal findings of blood chemistry: Secondary | ICD-10-CM

## 2018-06-09 DIAGNOSIS — K824 Cholesterolosis of gallbladder: Secondary | ICD-10-CM | POA: Diagnosis not present

## 2018-06-09 DIAGNOSIS — R945 Abnormal results of liver function studies: Principal | ICD-10-CM

## 2018-06-12 ENCOUNTER — Other Ambulatory Visit: Payer: Self-pay | Admitting: Physician Assistant

## 2018-06-12 DIAGNOSIS — K824 Cholesterolosis of gallbladder: Secondary | ICD-10-CM

## 2018-06-16 ENCOUNTER — Encounter: Payer: Self-pay | Admitting: Gastroenterology

## 2018-07-18 ENCOUNTER — Ambulatory Visit: Payer: BLUE CROSS/BLUE SHIELD | Admitting: Gastroenterology

## 2018-08-10 ENCOUNTER — Other Ambulatory Visit (INDEPENDENT_AMBULATORY_CARE_PROVIDER_SITE_OTHER): Payer: BLUE CROSS/BLUE SHIELD

## 2018-08-10 ENCOUNTER — Encounter: Payer: Self-pay | Admitting: Gastroenterology

## 2018-08-10 ENCOUNTER — Ambulatory Visit: Payer: BLUE CROSS/BLUE SHIELD | Admitting: Gastroenterology

## 2018-08-10 VITALS — BP 120/78 | HR 72 | Ht 71.0 in | Wt 178.0 lb

## 2018-08-10 DIAGNOSIS — K760 Fatty (change of) liver, not elsewhere classified: Secondary | ICD-10-CM

## 2018-08-10 DIAGNOSIS — R7401 Elevation of levels of liver transaminase levels: Secondary | ICD-10-CM

## 2018-08-10 DIAGNOSIS — R740 Nonspecific elevation of levels of transaminase and lactic acid dehydrogenase [LDH]: Secondary | ICD-10-CM

## 2018-08-10 DIAGNOSIS — K824 Cholesterolosis of gallbladder: Secondary | ICD-10-CM

## 2018-08-10 LAB — HEPATIC FUNCTION PANEL
ALT: 35 U/L (ref 0–53)
AST: 24 U/L (ref 0–37)
Albumin: 5.1 g/dL (ref 3.5–5.2)
Alkaline Phosphatase: 76 U/L (ref 39–117)
BILIRUBIN TOTAL: 0.4 mg/dL (ref 0.2–1.2)
Bilirubin, Direct: 0.1 mg/dL (ref 0.0–0.3)
Total Protein: 7.9 g/dL (ref 6.0–8.3)

## 2018-08-10 LAB — FERRITIN: Ferritin: 132.2 ng/mL (ref 22.0–322.0)

## 2018-08-10 NOTE — Progress Notes (Signed)
HPI :  33 year old male with a history of hyperlipidemia, elevated ALT, gallbladder polyp, referred here by Waldon Merl, PA-C for elevated ALT and gallbladder polyp.   The patient has labs in our system dating back to 2017. At that point in time his ALT was 63, AST, alkaline phosphatase, and bilirubin were normal. Over the years he's had a persistent ALT elevation ranging from 50s to 60s. Most recently in November he was noted to have an ALT of 90, again with normal remainder of his liver enzymes. He had an US of the liver on 06/2018 showing increased echotexture nonspecific but could be c/w fatty liver disease and a 3.53mm gallbladder polyp. He has no personal history of known liver disease. He denies any family history of liver disease. He denies any history of jaundice. He does not take any medications routinely. He used over-the-counter red yeast Rice supplement. He takes rare ibuprofen, nothing routinely. He denies any routine alcohol use. He denies any abdominal pain. Otherwise is well, no nausea or vomiting. No right upper quadrant pain. He has has a history of hyperlipidemia which she has been trying to manage with lifestyle changes. He weighs about the 170s or so. Given his worsening lipid profile he has had significant changes with diet and exercise over the past 2 months. He feels much healthier at this time.  He has 2-3 bowel movements a day, usually solid or soft and formed. No blood in the stools. He denies any family history of colon cancer. He states his wife wonders if he goes to the bathroom too frequently. He is not bothered by his bowel habits.   [place] 06/09/2018 - gallbladder polyp 3.21mm, otherwise normal CBC and increased echotexture c/w fatty liver   Past Medical History:  Diagnosis Date  . Elevated ALT measurement   . Gallbladder polyp   . Hyperlipidemia      Past Surgical History:  Procedure Laterality Date  . WISDOM TOOTH EXTRACTION     Family History  Problem  Relation Age of Onset  . Hypertension Mother   . Hyperlipidemia Father    Social History   Tobacco Use  . Smoking status: Never Smoker  . Smokeless tobacco: Never Used  Substance Use Topics  . Alcohol use: Yes    Alcohol/week: 0.0 standard drinks    Comment: rare  . Drug use: No   No current outpatient medications on file.   No current facility-administered medications for this visit.    No Known Allergies   Review of Systems: All systems reviewed and negative except where noted in HPI.    No results found.  Physical Exam: BP 120/78   Pulse 72   Ht 5\' 11"  (1.803 m)   Wt 178 lb (80.7 kg)   BMI 24.83 kg/m  Constitutional: Pleasant,well-developed, male in no acute distress. HEENT: Normocephalic and atraumatic. Conjunctivae are normal. No scleral icterus. Neck supple.  Cardiovascular: Normal rate, regular rhythm.  Pulmonary/chest: Effort normal and breath sounds normal. No wheezing, rales or rhonchi. Abdominal: Soft, nondistended, nontender. . There are no masses palpable. No hepatomegaly. Extremities: no edema Lymphadenopathy: No cervical adenopathy noted. Neurological: Alert and oriented to person place and time. Skin: Skin is warm and dry. No rashes noted. Psychiatric: Normal mood and affect. Behavior is normal.   ASSESSMENT AND PLAN: 33 year old male here for new patient consultation regarding the following issues:  Elevated ALT / fatty liver - history of ALT elevation dating back to 2017, ranging from 60 to 90s. I  discussed differential with him. His ultrasound seems most consistent with fatty liver disease, interestingly he is not overweight, and otherwise denies any alcohol use or medication use to cause this. While fatty liver is unusual in someone with his body mass index, it's possible it is driving this process in relation to his metabolic syndrome, although he warrants full serologic workup to rule out other causes of chronic liver disease. I discussed  differential with him and recommend full lab work to be done today. I will also repeat his LFTs today to see if there is any interval change after he has had significant changes in diet and exercise over the past few months. If related to fatty liver, his ALT could have improved with lifestyle change. Will await results of his blood work and I will contact him with that with further recommendations. He agreed with the plan  Gallbladder polyp - he is asymptomatic in this regard without any pain. I reassured him of the general benign nature of this, however an ultrasound is recommended 1 year from his last exam to ensure no interval growth. If this remains stable likely no further workup is needed. If it intervally enlarges over time, will need to consider cholecystectomy. He agreed with the plan.   Ileene Patrick, MD [facility]  CC: Waldon Merl, PA-C

## 2018-08-10 NOTE — Patient Instructions (Addendum)
If you are age 31 or older, your body mass index should be between 23-30. Your Body mass index is 24.83 kg/m. If this is out of the aforementioned range listed, please consider follow up with your Primary Care Provider.  If you are age 54 or younger, your body mass index should be between 19-25. Your Body mass index is 24.83 kg/m. If this is out of the aformentioned range listed, please consider follow up with your Primary Care Provider.   Please go to the lab in the basement of our building to have lab work done as you leave today. Hit "B" for basement when you get on the elevator.  When the doors open the lab is on your left.  We will call you with the results. Thank you.  You will be due for a repeat Ultrasound of the gallbladder in November 2020. We will call you to schedule when it is time.   Thank you for entrusting me with your care and for choosing [facility], Dr. Ileene Patrick

## 2018-08-13 LAB — ANA: ANA: NEGATIVE

## 2018-08-13 LAB — IRON, TOTAL/TOTAL IRON BINDING CAP
%SAT: 21 % (calc) (ref 20–48)
Iron: 66 ug/dL (ref 50–180)
TIBC: 319 mcg/dL (calc) (ref 250–425)

## 2018-08-13 LAB — IGG: IgG (Immunoglobin G), Serum: 1251 mg/dL (ref 600–1640)

## 2018-08-13 LAB — CERULOPLASMIN: Ceruloplasmin: 27 mg/dL (ref 18–36)

## 2018-08-13 LAB — HEPATITIS A ANTIBODY, TOTAL: HEPATITIS A AB,TOTAL: NONREACTIVE

## 2018-08-13 LAB — HEPATITIS C ANTIBODY
HEP C AB: NONREACTIVE
SIGNAL TO CUT-OFF: 0.03 (ref <1.00)

## 2018-08-13 LAB — HEPATITIS B SURFACE ANTIBODY,QUALITATIVE: Hep B S Ab: REACTIVE — AB

## 2018-08-13 LAB — HEPATITIS B SURFACE ANTIGEN: Hepatitis B Surface Ag: NONREACTIVE

## 2018-08-13 LAB — ANTI-SMOOTH MUSCLE ANTIBODY, IGG

## 2018-08-13 LAB — ALPHA-1-ANTITRYPSIN: A-1 Antitrypsin, Ser: 139 mg/dL (ref 83–199)

## 2018-08-16 ENCOUNTER — Other Ambulatory Visit: Payer: Self-pay

## 2018-08-16 DIAGNOSIS — Z23 Encounter for immunization: Secondary | ICD-10-CM

## 2018-08-16 NOTE — Progress Notes (Unsigned)
Hep A series needed (see result note).

## 2018-08-21 ENCOUNTER — Ambulatory Visit (INDEPENDENT_AMBULATORY_CARE_PROVIDER_SITE_OTHER): Payer: BLUE CROSS/BLUE SHIELD | Admitting: Gastroenterology

## 2018-08-21 DIAGNOSIS — Z23 Encounter for immunization: Secondary | ICD-10-CM

## 2018-10-30 ENCOUNTER — Encounter: Payer: BLUE CROSS/BLUE SHIELD | Admitting: Physician Assistant

## 2018-12-04 ENCOUNTER — Encounter: Payer: BLUE CROSS/BLUE SHIELD | Admitting: Physician Assistant

## 2018-12-15 ENCOUNTER — Ambulatory Visit (INDEPENDENT_AMBULATORY_CARE_PROVIDER_SITE_OTHER): Payer: BLUE CROSS/BLUE SHIELD | Admitting: Physician Assistant

## 2018-12-15 ENCOUNTER — Encounter: Payer: Self-pay | Admitting: Physician Assistant

## 2018-12-15 ENCOUNTER — Other Ambulatory Visit: Payer: Self-pay

## 2018-12-15 VITALS — BP 118/78 | HR 70 | Temp 98.1°F | Resp 14 | Ht 71.0 in | Wt 165.0 lb

## 2018-12-15 DIAGNOSIS — R748 Abnormal levels of other serum enzymes: Secondary | ICD-10-CM | POA: Diagnosis not present

## 2018-12-15 DIAGNOSIS — E785 Hyperlipidemia, unspecified: Secondary | ICD-10-CM

## 2018-12-15 DIAGNOSIS — Z0000 Encounter for general adult medical examination without abnormal findings: Secondary | ICD-10-CM | POA: Diagnosis not present

## 2018-12-15 LAB — CBC WITH DIFFERENTIAL/PLATELET
Basophils Absolute: 0 10*3/uL (ref 0.0–0.1)
Basophils Relative: 0.7 % (ref 0.0–3.0)
Eosinophils Absolute: 0.1 10*3/uL (ref 0.0–0.7)
Eosinophils Relative: 1.9 % (ref 0.0–5.0)
HCT: 46.2 % (ref 39.0–52.0)
Hemoglobin: 16 g/dL (ref 13.0–17.0)
Lymphocytes Relative: 38.6 % (ref 12.0–46.0)
Lymphs Abs: 2.1 10*3/uL (ref 0.7–4.0)
MCHC: 34.6 g/dL (ref 30.0–36.0)
MCV: 85.2 fl (ref 78.0–100.0)
Monocytes Absolute: 0.5 10*3/uL (ref 0.1–1.0)
Monocytes Relative: 9.3 % (ref 3.0–12.0)
Neutro Abs: 2.7 10*3/uL (ref 1.4–7.7)
Neutrophils Relative %: 49.5 % (ref 43.0–77.0)
Platelets: 231 10*3/uL (ref 150.0–400.0)
RBC: 5.42 Mil/uL (ref 4.22–5.81)
RDW: 13.1 % (ref 11.5–15.5)
WBC: 5.5 10*3/uL (ref 4.0–10.5)

## 2018-12-15 LAB — COMPREHENSIVE METABOLIC PANEL
ALT: 27 U/L (ref 0–53)
AST: 23 U/L (ref 0–37)
Albumin: 5 g/dL (ref 3.5–5.2)
Alkaline Phosphatase: 79 U/L (ref 39–117)
BUN: 18 mg/dL (ref 6–23)
CO2: 28 mEq/L (ref 19–32)
Calcium: 9.8 mg/dL (ref 8.4–10.5)
Chloride: 104 mEq/L (ref 96–112)
Creatinine, Ser: 1.04 mg/dL (ref 0.40–1.50)
GFR: 83.39 mL/min (ref >60.00)
Glucose, Bld: 88 mg/dL (ref 70–99)
Potassium: 4.3 mEq/L (ref 3.5–5.1)
Sodium: 142 mEq/L (ref 135–145)
Total Bilirubin: 0.6 mg/dL (ref 0.2–1.2)
Total Protein: 7.8 g/dL (ref 6.0–8.3)

## 2018-12-15 LAB — LIPID PANEL
Cholesterol: 198 mg/dL (ref 0–200)
HDL: 41.2 mg/dL (ref >39.00)
LDL Cholesterol: 142 mg/dL — ABNORMAL HIGH (ref 0–99)
NonHDL: 157.17
Total CHOL/HDL Ratio: 5
Triglycerides: 77 mg/dL (ref 0.0–149.0)
VLDL: 15.4 mg/dL (ref 0.0–40.0)

## 2018-12-15 LAB — HEMOGLOBIN A1C: Hgb A1c MFr Bld: 4.9 % (ref 4.6–6.5)

## 2018-12-15 NOTE — Progress Notes (Signed)
Patient presents to clinic today for annual exam.  Patient is fasting for labs.  Acute Concerns: Denies acute concerns today.  Chronic Issues: Hyperlipidemia -- Has made significant changes to diet and exercise, resulting in weight loss. Is avoiding sat/trans fats and sugars. Exercising 45 minutes daily, mixture of cardio (boxing) and resistance training throughout the week. Body mass index is 23.01 kg/m.   Lab Results  Component Value Date   LDLCALC 178 (H) 06/05/2018    Past Medical History:  Diagnosis Date  . Elevated ALT measurement   . Gallbladder polyp   . Hyperlipidemia     Past Surgical History:  Procedure Laterality Date  . WISDOM TOOTH EXTRACTION      No current outpatient medications on file prior to visit.   No current facility-administered medications on file prior to visit.     No Known Allergies  Family History  Problem Relation Age of Onset  . Hypertension Mother   . Hyperlipidemia Father     Social History   Socioeconomic History  . Marital status: Married    Spouse name: Not on file  . Number of children: 1  . Years of education: Not on file  . Highest education level: Not on file  Occupational History  . Occupation: Airline pilotales  Social Needs  . Financial resource strain: Not on file  . Food insecurity:    Worry: Not on file    Inability: Not on file  . Transportation needs:    Medical: Not on file    Non-medical: Not on file  Tobacco Use  . Smoking status: Never Smoker  . Smokeless tobacco: Never Used  Substance and Sexual Activity  . Alcohol use: Yes    Alcohol/week: 0.0 standard drinks    Comment: rare  . Drug use: No  . Sexual activity: Yes  Lifestyle  . Physical activity:    Days per week: Not on file    Minutes per session: Not on file  . Stress: Not on file  Relationships  . Social connections:    Talks on phone: Not on file    Gets together: Not on file    Attends religious service: Not on file    Active member of  club or organization: Not on file    Attends meetings of clubs or organizations: Not on file    Relationship status: Not on file  . Intimate partner violence:    Fear of current or ex partner: Not on file    Emotionally abused: Not on file    Physically abused: Not on file    Forced sexual activity: Not on file  Other Topics Concern  . Not on file  Social History Narrative  . Not on file   Review of Systems  Constitutional: Negative for fever and weight loss.  HENT: Negative for ear discharge, ear pain, hearing loss and tinnitus.   Eyes: Negative for blurred vision, double vision, photophobia and pain.  Respiratory: Negative for cough and shortness of breath.   Cardiovascular: Negative for chest pain and palpitations.  Gastrointestinal: Negative for abdominal pain, blood in stool, constipation, diarrhea, heartburn, melena, nausea and vomiting.  Genitourinary: Negative for dysuria, flank pain, frequency, hematuria and urgency.  Musculoskeletal: Negative for falls.  Neurological: Negative for dizziness, loss of consciousness and headaches.  Endo/Heme/Allergies: Negative for environmental allergies.  Psychiatric/Behavioral: Negative for depression, hallucinations, substance abuse and suicidal ideas. The patient is not nervous/anxious and does not have insomnia.    BP 118/78  Pulse 70   Temp 98.1 F (36.7 C) (Oral)   Resp 14   Ht 5\' 11"  (1.803 m)   Wt 165 lb (74.8 kg)   SpO2 99%   BMI 23.01 kg/m   Physical Exam Vitals signs reviewed.  Constitutional:      General: He is not in acute distress.    Appearance: He is well-developed. He is not diaphoretic.  HENT:     Head: Normocephalic and atraumatic.     Right Ear: Tympanic membrane, ear canal and external ear normal.     Left Ear: Tympanic membrane, ear canal and external ear normal.     Nose: Nose normal.     Mouth/Throat:     Pharynx: No posterior oropharyngeal erythema.  Eyes:     Conjunctiva/sclera: Conjunctivae  normal.     Pupils: Pupils are equal, round, and reactive to light.  Neck:     Musculoskeletal: Neck supple.     Thyroid: No thyromegaly.  Cardiovascular:     Rate and Rhythm: Normal rate and regular rhythm.     Heart sounds: Normal heart sounds.  Pulmonary:     Effort: Pulmonary effort is normal. No respiratory distress.     Breath sounds: Normal breath sounds. No wheezing or rales.  Chest:     Chest wall: No tenderness.  Abdominal:     General: Bowel sounds are normal. There is no distension.     Palpations: Abdomen is soft. There is no mass.     Tenderness: There is no abdominal tenderness. There is no guarding or rebound.  Lymphadenopathy:     Cervical: No cervical adenopathy.  Skin:    General: Skin is warm and dry.     Findings: No rash.  Neurological:     Mental Status: He is alert and oriented to person, place, and time.     Cranial Nerves: No cranial nerve deficit.    Assessment/Plan: 1. Visit for preventive health examination Depression screen negative. Health Maintenance reviewed. Preventive schedule discussed and handout given in AVS. Will obtain fasting labs today.  - CBC with Differential/Platelet - Comprehensive metabolic panel - Lipid panel - Hemoglobin A1c  2. Elevated liver enzymes Followed by GI. Suspected mild fatty liver. Has follow-up US scheduled for fall. Last LFTs within normal limits. Will recheck today.  3. Hyperlipidemia, unspecified hyperlipidemia type Made significant changes to diet and exercise with noted weight loss. Repeat fasting lipid panel today.    Piedad Climes, PA-C

## 2018-12-15 NOTE — Patient Instructions (Signed)
-Please go to the lab for blood work. - -Our office will call you with your results unless you have chosen to receive results via MyChart.  -If your blood work is normal we will follow-up each year for physicals and as scheduled for chronic medical problems.  -If anything is abnormal we will treat accordingly and get you in for a follow-up.    Preventive Care 18-39 Years, Male Preventive care refers to lifestyle choices and visits with your health care provider that can promote health and wellness. What does preventive care include?   A yearly physical exam. This is also called an annual well check.  Dental exams once or twice a year.  Routine eye exams. Ask your health care provider how often you should have your eyes checked.  Personal lifestyle choices, including: ? Daily care of your teeth and gums. ? Regular physical activity. ? Eating a healthy diet. ? Avoiding tobacco and drug use. ? Limiting alcohol use. ? Practicing safe sex. What happens during an annual well check? The services and screenings done by your health care provider during your annual well check will depend on your age, overall health, lifestyle risk factors, and family history of disease. Counseling Your health care provider may ask you questions about your:  Alcohol use.  Tobacco use.  Drug use.  Emotional well-being.  Home and relationship well-being.  Sexual activity.  Eating habits.  Work and work Statistician. Screening You may have the following tests or measurements:  Height, weight, and BMI.  Blood pressure.  Lipid and cholesterol levels. These may be checked every 5 years starting at age 53.  Diabetes screening. This is done by checking your blood sugar (glucose) after you have not eaten for a while (fasting).  Skin check.  Hepatitis C blood test.  Hepatitis B blood test.  Sexually transmitted disease (STD) testing. Discuss your test results, treatment options, and if  necessary, the need for more tests with your health care provider. Vaccines Your health care provider may recommend certain vaccines, such as:  Influenza vaccine. This is recommended every year.  Tetanus, diphtheria, and acellular pertussis (Tdap, Td) vaccine. You may need a Td booster every 10 years.  Varicella vaccine. You may need this if you have not been vaccinated.  HPV vaccine. If you are 68 or younger, you may need three doses over 6 months.  Measles, mumps, and rubella (MMR) vaccine. You may need at least one dose of MMR.You may also need a second dose.  Pneumococcal 13-valent conjugate (PCV13) vaccine. You may need this if you have certain conditions and have not been vaccinated.  Pneumococcal polysaccharide (PPSV23) vaccine. You may need one or two doses if you smoke cigarettes or if you have certain conditions.  Meningococcal vaccine. One dose is recommended if you are age 3-21 years and a first-year college student living in a residence hall, or if you have one of several medical conditions. You may also need additional booster doses.  Hepatitis A vaccine. You may need this if you have certain conditions or if you travel or work in places where you may be exposed to hepatitis A.  Hepatitis B vaccine. You may need this if you have certain conditions or if you travel or work in places where you may be exposed to hepatitis B.  Haemophilus influenzae type b (Hib) vaccine. You may need this if you have certain risk factors. Talk to your health care provider about which screenings and vaccines you need and how often  This information is not intended to replace advice given to you by your health care provider. Make sure you discuss any questions you have with your health care provider. Document Released: 09/14/2001 Document Revised: 03/01/2017 Document Reviewed: 05/20/2015 Elsevier Interactive Patient Education  2019 Elsevier Inc.   

## 2019-02-08 ENCOUNTER — Telehealth: Payer: Self-pay

## 2019-02-08 DIAGNOSIS — R7401 Elevation of levels of liver transaminase levels: Secondary | ICD-10-CM

## 2019-02-08 DIAGNOSIS — K760 Fatty (change of) liver, not elsewhere classified: Secondary | ICD-10-CM

## 2019-02-08 NOTE — Telephone Encounter (Signed)
-----   Message from Roetta Sessions, [place] sent at 08/16/2018  4:33 PM EST ----- Regarding: 2nd Hep A inj and LFTs due LFTs due in July for elevated ALT and fatty liver  2nd and final Hep A vaccine due July 21st +.  Schedule together

## 2019-02-08 NOTE — Telephone Encounter (Signed)
Called pt.  Scheduled him for his 2nd and final Hep A.  He will also come for labs that day - LFTs - orders are in.

## 2019-02-20 ENCOUNTER — Ambulatory Visit (INDEPENDENT_AMBULATORY_CARE_PROVIDER_SITE_OTHER): Payer: BC Managed Care – PPO | Admitting: Gastroenterology

## 2019-02-20 ENCOUNTER — Other Ambulatory Visit (INDEPENDENT_AMBULATORY_CARE_PROVIDER_SITE_OTHER): Payer: BC Managed Care – PPO

## 2019-02-20 DIAGNOSIS — K760 Fatty (change of) liver, not elsewhere classified: Secondary | ICD-10-CM | POA: Diagnosis not present

## 2019-02-20 DIAGNOSIS — R7401 Elevation of levels of liver transaminase levels: Secondary | ICD-10-CM

## 2019-02-20 DIAGNOSIS — R740 Nonspecific elevation of levels of transaminase and lactic acid dehydrogenase [LDH]: Secondary | ICD-10-CM

## 2019-02-20 DIAGNOSIS — Z23 Encounter for immunization: Secondary | ICD-10-CM | POA: Diagnosis not present

## 2019-02-20 LAB — HEPATIC FUNCTION PANEL
ALT: 33 U/L (ref 0–53)
AST: 23 U/L (ref 0–37)
Albumin: 5 g/dL (ref 3.5–5.2)
Alkaline Phosphatase: 78 U/L (ref 39–117)
Bilirubin, Direct: 0.1 mg/dL (ref 0.0–0.3)
Total Bilirubin: 0.5 mg/dL (ref 0.2–1.2)
Total Protein: 7.7 g/dL (ref 6.0–8.3)

## 2019-06-07 ENCOUNTER — Telehealth: Payer: Self-pay | Admitting: Gastroenterology

## 2019-06-07 ENCOUNTER — Telehealth: Payer: Self-pay

## 2019-06-07 DIAGNOSIS — K824 Cholesterolosis of gallbladder: Secondary | ICD-10-CM

## 2019-06-07 DIAGNOSIS — K760 Fatty (change of) liver, not elsewhere classified: Secondary | ICD-10-CM

## 2019-06-07 NOTE — Telephone Encounter (Signed)
Called and LM for pt that I am scheduling his Ultrasound at [facility].  Sent letter to [facility] with date and time with the number for radiology incase pt would like to reschedule to a different date or time.  Scheduled for Wed, 11-18 at 8:30am.

## 2019-06-07 NOTE — Telephone Encounter (Signed)
-----   Message from Roetta Sessions, [place] sent at 08/16/2018  4:30 PM EST ----- Regarding: U/S due Nov-2020 Pt due for repeat U/S in November Gallbladder polyp/fatty liver

## 2019-06-07 NOTE — Telephone Encounter (Signed)
Okay that's fine, thanks  

## 2019-06-07 NOTE — Telephone Encounter (Signed)
Patient called and requesting his [place] be changed to January because he is changing jobs. Rescheduled for 08/10/19. Patient to arrive at 8:45am and be NPO after midnight

## 2019-06-20 ENCOUNTER — Ambulatory Visit (HOSPITAL_COMMUNITY): Payer: BC Managed Care – PPO

## 2019-08-10 ENCOUNTER — Ambulatory Visit (HOSPITAL_COMMUNITY): Payer: BLUE CROSS/BLUE SHIELD

## 2019-08-17 ENCOUNTER — Ambulatory Visit (HOSPITAL_COMMUNITY)
Admission: RE | Admit: 2019-08-17 | Discharge: 2019-08-17 | Disposition: A | Discharge status: Home / Self Care | Payer: BC Managed Care – PPO | Source: Ambulatory Visit | Attending: Gastroenterology | Admitting: Gastroenterology

## 2019-08-17 ENCOUNTER — Other Ambulatory Visit: Payer: Self-pay

## 2019-08-17 DIAGNOSIS — K824 Cholesterolosis of gallbladder: Secondary | ICD-10-CM | POA: Diagnosis not present

## 2019-08-17 DIAGNOSIS — K760 Fatty (change of) liver, not elsewhere classified: Secondary | ICD-10-CM | POA: Diagnosis not present

## 2019-11-01 DIAGNOSIS — Z03818 Encounter for observation for suspected exposure to other biological agents ruled out: Secondary | ICD-10-CM | POA: Diagnosis not present

## 2019-11-01 DIAGNOSIS — Z20828 Contact with and (suspected) exposure to other viral communicable diseases: Secondary | ICD-10-CM | POA: Diagnosis not present

## 2019-12-17 ENCOUNTER — Encounter: Payer: BLUE CROSS/BLUE SHIELD | Admitting: Physician Assistant

## 2020-02-14 ENCOUNTER — Telehealth: Payer: Self-pay | Admitting: *Deleted

## 2020-02-14 NOTE — Telephone Encounter (Signed)
-----   Message from Cooper Render, CMA sent at 02/20/2019  4:42 PM EDT ----- Regarding: 1 yr repeat LFTs Repeat LFTs in 01-2020 (fatty liver and elevated ALT)

## 2020-02-14 NOTE — Telephone Encounter (Signed)
Left message for patient to call back  

## 2020-02-25 ENCOUNTER — Ambulatory Visit (INDEPENDENT_AMBULATORY_CARE_PROVIDER_SITE_OTHER): Payer: Managed Care, Other (non HMO) | Admitting: Physician Assistant

## 2020-02-25 ENCOUNTER — Encounter: Payer: Self-pay | Admitting: Physician Assistant

## 2020-02-25 ENCOUNTER — Other Ambulatory Visit: Payer: Self-pay

## 2020-02-25 VITALS — BP 110/70 | HR 78 | Temp 98.0°F | Resp 16 | Ht 71.0 in | Wt 162.0 lb

## 2020-02-25 DIAGNOSIS — Z0000 Encounter for general adult medical examination without abnormal findings: Secondary | ICD-10-CM

## 2020-02-25 LAB — LIPID PANEL
Cholesterol: 201 mg/dL — ABNORMAL HIGH (ref 0–200)
HDL: 37.5 mg/dL — ABNORMAL LOW (ref >39.00)
LDL Cholesterol: 144 mg/dL — ABNORMAL HIGH (ref 0–99)
NonHDL: 163.38
Total CHOL/HDL Ratio: 5
Triglycerides: 98 mg/dL (ref 0.0–149.0)
VLDL: 19.6 mg/dL (ref 0.0–40.0)

## 2020-02-25 LAB — COMPREHENSIVE METABOLIC PANEL
ALT: 27 U/L (ref 0–53)
AST: 19 U/L (ref 0–37)
Albumin: 4.9 g/dL (ref 3.5–5.2)
Alkaline Phosphatase: 71 U/L (ref 39–117)
BUN: 18 mg/dL (ref 6–23)
CO2: 30 mEq/L (ref 19–32)
Calcium: 10.1 mg/dL (ref 8.4–10.5)
Chloride: 105 mEq/L (ref 96–112)
Creatinine, Ser: 0.96 mg/dL (ref 0.40–1.50)
GFR: 90.75 mL/min (ref >60.00)
Glucose, Bld: 84 mg/dL (ref 70–99)
Potassium: 4.3 mEq/L (ref 3.5–5.1)
Sodium: 141 mEq/L (ref 135–145)
Total Bilirubin: 0.7 mg/dL (ref 0.2–1.2)
Total Protein: 7.6 g/dL (ref 6.0–8.3)

## 2020-02-25 LAB — CBC WITH DIFFERENTIAL/PLATELET
Basophils Absolute: 0 10*3/uL (ref 0.0–0.1)
Basophils Relative: 0.9 % (ref 0.0–3.0)
Eosinophils Absolute: 0.1 10*3/uL (ref 0.0–0.7)
Eosinophils Relative: 2 % (ref 0.0–5.0)
HCT: 43.4 % (ref 39.0–52.0)
Hemoglobin: 15.1 g/dL (ref 13.0–17.0)
Lymphocytes Relative: 34.5 % (ref 12.0–46.0)
Lymphs Abs: 1.7 10*3/uL (ref 0.7–4.0)
MCHC: 34.9 g/dL (ref 30.0–36.0)
MCV: 85.1 fl (ref 78.0–100.0)
Monocytes Absolute: 0.4 10*3/uL (ref 0.1–1.0)
Monocytes Relative: 8.3 % (ref 3.0–12.0)
Neutro Abs: 2.7 10*3/uL (ref 1.4–7.7)
Neutrophils Relative %: 54.3 % (ref 43.0–77.0)
Platelets: 217 10*3/uL (ref 150.0–400.0)
RBC: 5.09 Mil/uL (ref 4.22–5.81)
RDW: 12.9 % (ref 11.5–15.5)
WBC: 5.1 10*3/uL (ref 4.0–10.5)

## 2020-02-25 LAB — HEMOGLOBIN A1C: Hgb A1c MFr Bld: 4.8 % (ref 4.6–6.5)

## 2020-02-25 NOTE — Progress Notes (Signed)
Patient presents to clinic today for annual exam.  Patient is fasting for labs.  Diet -- Overall keeping a well-balanced diet. Notes some weeks he does much better than others.   Exercise -- Previously working out regularly but over the past few months this has been harder due to work schedule.   Body mass index is 22.59 kg/m.  Acute Concerns: Denies acute concerns today.   Health Maintenance: Immunizations -- UTD  Past Medical History:  Diagnosis Date  . Elevated ALT measurement   . Gallbladder polyp   . Hyperlipidemia     Past Surgical History:  Procedure Laterality Date  . WISDOM TOOTH EXTRACTION      No current outpatient medications on file prior to visit.   No current facility-administered medications on file prior to visit.    No Known Allergies  Family History  Problem Relation Age of Onset  . Hypertension Mother   . Hyperlipidemia Father     Social History   Socioeconomic History  . Marital status: Married    Spouse name: Not on file  . Number of children: 1  . Years of education: Not on file  . Highest education level: Not on file  Occupational History  . Occupation: Sales  Tobacco Use  . Smoking status: Never Smoker  . Smokeless tobacco: Never Used  Vaping Use  . Vaping Use: Never used  Substance and Sexual Activity  . Alcohol use: Yes    Alcohol/week: 0.0 standard drinks    Comment: rare  . Drug use: No  . Sexual activity: Yes  Other Topics Concern  . Not on file  Social History Narrative  . Not on file   Social Determinants of Health   Financial Resource Strain:   . Difficulty of Paying Living Expenses:   Food Insecurity:   . Worried About Programme researcher, broadcasting/film/video in the Last Year:   . Barista in the Last Year:   Transportation Needs:   . Freight forwarder (Medical):   Marland Kitchen Lack of Transportation (Non-Medical):   Physical Activity:   . Days of Exercise per Week:   . Minutes of Exercise per Session:   Stress:   .  Feeling of Stress :   Social Connections:   . Frequency of Communication with Friends and Family:   . Frequency of Social Gatherings with Friends and Family:   . Attends Religious Services:   . Active Member of Clubs or Organizations:   . Attends Banker Meetings:   Marland Kitchen Marital Status:   Intimate Partner Violence:   . Fear of Current or Ex-Partner:   . Emotionally Abused:   Marland Kitchen Physically Abused:   . Sexually Abused:    Review of Systems  Constitutional: Negative for fever and weight loss.  HENT: Negative for ear discharge, ear pain, hearing loss and tinnitus.   Eyes: Negative for blurred vision, double vision, photophobia and pain.  Respiratory: Negative for cough and shortness of breath.   Cardiovascular: Negative for chest pain and palpitations.  Gastrointestinal: Negative for abdominal pain, blood in stool, constipation, diarrhea, heartburn, melena, nausea and vomiting.  Genitourinary: Negative for dysuria, flank pain, frequency, hematuria and urgency.       Nocturia x 0  Musculoskeletal: Negative for falls.  Neurological: Negative for dizziness, loss of consciousness and headaches.  Endo/Heme/Allergies: Negative for environmental allergies.  Psychiatric/Behavioral: Negative for depression, hallucinations, substance abuse and suicidal ideas. The patient is not nervous/anxious and does not have insomnia.  Resp 16   Ht 5\' 11"  (1.803 m)   Wt 162 lb (73.5 kg)   BMI 22.59 kg/m   Physical Exam Vitals reviewed.  Constitutional:      General: He is not in acute distress.    Appearance: He is well-developed. He is not diaphoretic.  HENT:     Head: Normocephalic and atraumatic.     Right Ear: Tympanic membrane, ear canal and external ear normal.     Left Ear: Tympanic membrane, ear canal and external ear normal.     Nose: Nose normal.     Mouth/Throat:     Pharynx: No posterior oropharyngeal erythema.  Eyes:     Conjunctiva/sclera: Conjunctivae normal.     Pupils:  Pupils are equal, round, and reactive to light.  Neck:     Thyroid: No thyromegaly.  Cardiovascular:     Rate and Rhythm: Normal rate and regular rhythm.     Heart sounds: Normal heart sounds.  Pulmonary:     Effort: Pulmonary effort is normal. No respiratory distress.     Breath sounds: Normal breath sounds. No wheezing or rales.  Chest:     Chest wall: No tenderness.  Abdominal:     General: Bowel sounds are normal. There is no distension.     Palpations: Abdomen is soft. There is no mass.     Tenderness: There is no abdominal tenderness. There is no guarding or rebound.  Musculoskeletal:     Cervical back: Neck supple.  Lymphadenopathy:     Cervical: No cervical adenopathy.  Skin:    General: Skin is warm and dry.     Findings: No rash.  Neurological:     Mental Status: He is alert and oriented to person, place, and time.     Cranial Nerves: No cranial nerve deficit.    Assessment/Plan: 1. Visit for preventive health examination Depression screen negative. Health Maintenance reviewed. Preventive schedule discussed and handout given in AVS. Will obtain fasting labs today.   This visit occurred during the SARS-CoV-2 public health emergency.  Safety protocols were in place, including screening questions prior to the visit, additional usage of staff PPE, and extensive cleaning of exam room while observing appropriate contact time as indicated for disinfecting solutions.     , PA-C

## 2020-02-25 NOTE — Patient Instructions (Signed)
Please go to the lab for blood work.   Our office will call you with your results unless you have chosen to receive results via MyChart.  If your blood work is normal we will follow-up each year for physicals and as scheduled for chronic medical problems.  If anything is abnormal we will treat accordingly and get you in for a follow-up.   Preventive Care 32-32 Years Old, Male Preventive care refers to lifestyle choices and visits with your health care provider that can promote health and wellness. This includes:  A yearly physical exam. This is also called an annual well check.  Regular dental and eye exams.  Immunizations.  Screening for certain conditions.  Healthy lifestyle choices, such as eating a healthy diet, getting regular exercise, not using drugs or products that contain nicotine and tobacco, and limiting alcohol use. What can I expect for my preventive care visit? Physical exam Your health care provider will check:  Height and weight. These may be used to calculate body mass index (BMI), which is a measurement that tells if you are at a healthy weight.  Heart rate and blood pressure.  Your skin for abnormal spots. Counseling Your health care provider may ask you questions about:  Alcohol, tobacco, and drug use.  Emotional well-being.  Home and relationship well-being.  Sexual activity.  Eating habits.  Work and work Statistician. What immunizations do I need?  Influenza (flu) vaccine  This is recommended every year. Tetanus, diphtheria, and pertussis (Tdap) vaccine  You may need a Td booster every 10 years. Varicella (chickenpox) vaccine  You may need this vaccine if you have not already been vaccinated. Human papillomavirus (HPV) vaccine  If recommended by your health care provider, you may need three doses over 6 months. Measles, mumps, and rubella (MMR) vaccine  You may need at least one dose of MMR. You may also need a second  dose. Meningococcal conjugate (MenACWY) vaccine  One dose is recommended if you are 41-67 years old and a Market researcher living in a residence hall, or if you have one of several medical conditions. You may also need additional booster doses. Pneumococcal conjugate (PCV13) vaccine  You may need this if you have certain conditions and were not previously vaccinated. Pneumococcal polysaccharide (PPSV23) vaccine  You may need one or two doses if you smoke cigarettes or if you have certain conditions. Hepatitis A vaccine  You may need this if you have certain conditions or if you travel or work in places where you may be exposed to hepatitis A. Hepatitis B vaccine  You may need this if you have certain conditions or if you travel or work in places where you may be exposed to hepatitis B. Haemophilus influenzae type b (Hib) vaccine  You may need this if you have certain risk factors. You may receive vaccines as individual doses or as more than one vaccine together in one shot (combination vaccines). Talk with your health care provider about the risks and benefits of combination vaccines. What tests do I need? Blood tests  Lipid and cholesterol levels. These may be checked every 5 years starting at age 80.  Hepatitis C test.  Hepatitis B test. Screening   Diabetes screening. This is done by checking your blood sugar (glucose) after you have not eaten for a while (fasting).  Sexually transmitted disease (STD) testing. Talk with your health care provider about your test results, treatment options, and if necessary, the need for more tests. Follow these  instructions at home: Eating and drinking   Eat a diet that includes fresh fruits and vegetables, whole grains, lean protein, and low-fat dairy products.  Take vitamin and mineral supplements as recommended by your health care provider.  Do not drink alcohol if your health care provider tells you not to drink.  If you  drink alcohol: ? Limit how much you have to 0-2 drinks a day. ? Be aware of how much alcohol is in your drink. In the [place] one drink equals one 12 oz bottle of beer (355 mL), one 5 oz glass of wine (148 mL), or one 1 oz glass of hard liquor (44 mL). Lifestyle  Take daily care of your teeth and gums.  Stay active. Exercise for at least 30 minutes on 5 or more days each week.  Do not use any products that contain nicotine or tobacco, such as cigarettes, e-cigarettes, and chewing tobacco. If you need help quitting, ask your health care provider.  If you are sexually active, practice safe sex. Use a condom or other form of protection to prevent STIs (sexually transmitted infections). What's next?  Go to your health care provider once a year for a well check visit.  Ask your health care provider how often you should have your eyes and teeth checked.  Stay up to date on all vaccines. This information is not intended to replace advice given to you by your health care provider. Make sure you discuss any questions you have with your health care provider. Document Revised: 07/13/2018 Document Reviewed: 07/13/2018 Elsevier Patient Education  2020 Elsevier Inc. .      

## 2020-08-06 ENCOUNTER — Other Ambulatory Visit: Payer: Self-pay

## 2020-08-06 ENCOUNTER — Telehealth: Payer: Self-pay

## 2020-08-06 DIAGNOSIS — K824 Cholesterolosis of gallbladder: Secondary | ICD-10-CM

## 2020-08-06 DIAGNOSIS — K760 Fatty (change of) liver, not elsewhere classified: Secondary | ICD-10-CM

## 2020-08-06 NOTE — Telephone Encounter (Signed)
-----   Message from Cooper Render, CMA sent at 08/17/2019  5:35 PM EST ----- Regarding: RUQ ultrasound Recall RUQ U/S in 08-2020.  Gallbladder polyp and fatty liver

## 2020-08-06 NOTE — Telephone Encounter (Signed)
Pt is due for RUQ ultrasound for gallbladder polyp and fatty liver.  Scheduled pt for Thursday, 1-20th at 8:00am to arrive at 7:45 arrival, NPO after midnight.  Mychart message sent to pt.

## 2020-08-06 NOTE — Progress Notes (Signed)
Pt due for RUQ ultrasound for gallbladder polyp and fatty liver

## 2020-08-21 ENCOUNTER — Ambulatory Visit (HOSPITAL_COMMUNITY)
Admission: RE | Admit: 2020-08-21 | Discharge: 2020-08-21 | Disposition: A | Discharge status: Home / Self Care | Payer: Managed Care, Other (non HMO) | Source: Ambulatory Visit | Attending: Gastroenterology | Admitting: Gastroenterology

## 2020-08-21 ENCOUNTER — Other Ambulatory Visit: Payer: Self-pay

## 2020-08-21 DIAGNOSIS — K760 Fatty (change of) liver, not elsewhere classified: Secondary | ICD-10-CM | POA: Diagnosis present

## 2020-08-21 DIAGNOSIS — K824 Cholesterolosis of gallbladder: Secondary | ICD-10-CM | POA: Diagnosis present

## 2020-09-18 ENCOUNTER — Ambulatory Visit: Payer: Self-pay | Admitting: Surgery

## 2020-09-18 NOTE — H&P (Signed)
Clinic H&P 09/15/20:  History of Present Illness (Demetria Lightsey L. Freida Busman MD; 09/15/2020 9:26 AM) Patient words: HPI: Mr. Laufer is a 35 yo male who was referred for evaluation of gallbladder polyps. He was incidentally noted to have a mild elevation in ALT in 2017, which has been followed by his PCP and Dr. Adela Lank at [facility]. His ALT continued to rise and in November 2019 he had a RUQ [place], which showed several gallbladder polyps, the largest of which measured 3.27mm. He has undergone annual surveillance of the polyps with ultrasounds. His 2021 US showed a 34mm polyp, and his most recently US showed that the largest polyp was 5.52mm. He was referred to me for surgical consultation. The patient denies any abdominal pain. No jaundice. He has no family history of of gallbladder or liver cancer. His ALT has normalized and bilirubin remains normal.  PMH: HLD  PSH: wisdom tooth extraction  FHx: Denies family history of hepatobiliary or pancreatic cancers.  Social: Never smoker, occasional EtOH.  The patient is a 35 year old male.   Past Surgical History Marliss Coots, CNA; 09/15/2020 8:54 AM) Oral Surgery   Diagnostic Studies History Marliss Coots, CNA; 09/15/2020 8:54 AM) Colonoscopy  never  Allergies Marliss Coots, CNA; 09/15/2020 8:55 AM) No Known Drug Allergies  [09/15/2020]: Allergies Reconciled   Medication History Marliss Coots, CNA; 09/15/2020 8:55 AM) Moxifloxacin HCl (0.5% Solution, Ophthalmic) Active. Medications Reconciled  Social History Marliss Coots, CNA; 09/15/2020 8:54 AM) Alcohol use  Occasional alcohol use. Caffeine use  Carbonated beverages, Coffee. No drug use  Tobacco use  Never smoker.  Family History Marliss Coots, CNA; 09/15/2020 8:54 AM) Hypertension  Father.  Other Problems Marliss Coots, CNA; 09/15/2020 8:54 AM) No pertinent past medical history     Review of Systems [facility] [place] CNA; 09/15/2020 8:54 AM) General Not Present-  Appetite Loss, Chills, Fatigue, Fever, Night Sweats, Weight Gain and Weight Loss. Skin Not Present- Change in Wart/Mole, Dryness, Hives, Jaundice, New Lesions, Non-Healing Wounds, Rash and Ulcer. HEENT Not Present- Earache, Hearing Loss, Hoarseness, Nose Bleed, Oral Ulcers, Ringing in the Ears, Seasonal Allergies, Sinus Pain, Sore Throat, Visual Disturbances, Wears glasses/contact lenses and Yellow Eyes. Respiratory Not Present- Bloody sputum, Chronic Cough, Difficulty Breathing, Snoring and Wheezing. Breast Not Present- Breast Mass, Breast Pain, Nipple Discharge and Skin Changes. Cardiovascular Not Present- Chest Pain, Difficulty Breathing Lying Down, Leg Cramps, Palpitations, Rapid Heart Rate, Shortness of Breath and Swelling of Extremities. Gastrointestinal Not Present- Abdominal Pain, Bloating, Bloody Stool, Change in Bowel Habits, Chronic diarrhea, Constipation, Difficulty Swallowing, Excessive gas, Gets full quickly at meals, Hemorrhoids, Indigestion, Nausea, Rectal Pain and Vomiting. Male Genitourinary Not Present- Blood in Urine, Change in Urinary Stream, Frequency, Impotence, Nocturia, Painful Urination, Urgency and Urine Leakage. Musculoskeletal Not Present- Back Pain, Joint Pain, Joint Stiffness, Muscle Pain, Muscle Weakness and Swelling of Extremities. Neurological Not Present- Decreased Memory, Fainting, Headaches, Numbness, Seizures, Tingling, Tremor, Trouble walking and Weakness. Psychiatric Not Present- Anxiety, Bipolar, Change in Sleep Pattern, Depression, Fearful and Frequent crying. Hematology Not Present- Blood Thinners, Easy Bruising, Excessive bleeding, Gland problems, HIV and Persistent Infections.  Vitals Daiva Nakayama [place] CNA; 09/15/2020 8:55 AM) 09/15/2020 8:55 AM Weight: 170.5 lb Height: 71in Body Surface Area: 1.97 m Body Mass Index: 23.78 kg/m  Temp.: 16F  Pulse: 79 (Regular)  P.OX: 98% (Room air) BP: 120/60(Sitting, Left Arm, Standard)       Physical  Exam (Creston Klas L. Freida Busman MD; 09/15/2020 9:26 AM) The physical exam findings are as follows: Note: Constitutional: No acute  distress; conversant; no deformities Neuro: alert and oriented; cranial nerves grossly in tact; no focal deficits Eyes: Moist conjunctiva; anicteric sclerae; extraocular movements in tact Neck: Trachea midline Lungs: Normal respiratory effort; lungs clear to auscultation bilaterally; symmetric chest wall expansion CV: Regular rate and rhythm; no murmurs; no pitting edema GI: Abdomen soft, nontender, nondistended; no masses or organomegaly MSK: Normal gait and station; no clubbing/cyanosis Psychiatric: Appropriate affect; alert and oriented 3    Assessment & Plan (Jacklin Zwick L. Freida Busman MD; 09/15/2020 9:32 AM) GALLBLADDER POLYP (K82.4) Story: 35 yo previously healthy presenting with incidental gallbladder polyps. I personally reviewed his [place] images. He has multiple small polyps, the largest of which is close to 29mm. This one appears stable over the last year, with possibly mild growth since the first [place] in 2021. I discussed with the patient that cholecystectomy is generally recommended for polyps larger than 1cm or those with rapid growth. Given his young age and relative stability of the polyp, I think these are highly unlikely to be neoplastic or malignant. However, I do think the cholecystectomy would be reasonable to eliminate the risk of malignant transformation later in life and so that he does not have to undergo continued long-term surveillance. He is an excellent surgical candidate. We discussed the details of laparoscopic cholecystectomy and the risks and benefits, including bleeding, infection, and <0.5% risk of common bile duct injury. He is leaning towards surgery but would like to discuss with family before making a final decision. He will call when he makes a decision. If he decides not to pursue surgery at this time, he will follow up in 6 months with a repeat  ultrasound.  Addendum Note(Delsa Walder L. Freida Busman MD; 09/18/2020 8:48 AM) Patient called and would like to proceed with surgery. Scheduling request submitted.

## 2020-10-14 ENCOUNTER — Other Ambulatory Visit (HOSPITAL_COMMUNITY)
Admission: RE | Admit: 2020-10-14 | Discharge: 2020-10-14 | Disposition: A | Discharge status: Home / Self Care | Payer: Managed Care, Other (non HMO) | Source: Ambulatory Visit | Attending: Surgery | Admitting: Surgery

## 2020-10-14 DIAGNOSIS — Z20822 Contact with and (suspected) exposure to covid-19: Secondary | ICD-10-CM | POA: Insufficient documentation

## 2020-10-14 DIAGNOSIS — Z01812 Encounter for preprocedural laboratory examination: Secondary | ICD-10-CM | POA: Diagnosis present

## 2020-10-14 LAB — SARS CORONAVIRUS 2 (TAT 6-24 HRS): SARS Coronavirus 2: NEGATIVE

## 2020-10-16 ENCOUNTER — Encounter (HOSPITAL_COMMUNITY): Payer: Self-pay | Admitting: Surgery

## 2020-10-16 NOTE — Progress Notes (Signed)
EKG: na CXR: na ECHO: denies Stress Test: denies Cardiac Cath: denies  Fasting Blood Sugar- na Checks Blood Sugar__na_ times a day  OSA/CPAP:  No  ASA/Blood Thinners:  No  Covid test 10/14/20 negative  Anesthesia Review: No  Patient denies shortness of breath, fever, cough, and chest pain at PAT appointment.  Patient verbalized understanding of instructions provided today at the PAT appointment.  Patient asked to review instructions at home and day of surgery.

## 2020-10-16 NOTE — Anesthesia Preprocedure Evaluation (Addendum)
Anesthesia Evaluation  Patient identified by MRN, date of birth, ID band Patient awake    Reviewed: Allergy & Precautions, NPO status , Patient's Chart, lab work & pertinent test results  History of Anesthesia Complications Negative for: history of anesthetic complications  Airway Mallampati: II  TM Distance: >3 FB Neck ROM: Full    Dental  (+) Dental Advisory Given   Pulmonary neg pulmonary ROS,    Pulmonary exam normal        Cardiovascular negative cardio ROS Normal cardiovascular exam     Neuro/Psych negative neurological ROS  negative psych ROS   GI/Hepatic negative GI ROS, Neg liver ROS,   Endo/Other  negative endocrine ROS  Renal/GU negative Renal ROS     Musculoskeletal negative musculoskeletal ROS (+)   Abdominal   Peds  Hematology negative hematology ROS (+)   Anesthesia Other Findings Covid test negative   Reproductive/Obstetrics                            Anesthesia Physical Anesthesia Plan  ASA: I  Anesthesia Plan: General   Post-op Pain Management:    Induction: Intravenous  PONV Risk Score and Plan: 4 or greater and Treatment may vary due to age or medical condition, Ondansetron, Midazolam and Dexamethasone  Airway Management Planned: Oral ETT  Additional Equipment: None  Intra-op Plan:   Post-operative Plan: Extubation in OR  Informed Consent: I have reviewed the patients History and Physical, chart, labs and discussed the procedure including the risks, benefits and alternatives for the proposed anesthesia with the patient or authorized representative who has indicated his/her understanding and acceptance.     Dental advisory given  Plan Discussed with: CRNA and Anesthesiologist  Anesthesia Plan Comments:        Anesthesia Quick Evaluation

## 2020-10-17 ENCOUNTER — Encounter (HOSPITAL_COMMUNITY): Payer: Self-pay | Admitting: Surgery

## 2020-10-17 ENCOUNTER — Ambulatory Visit (HOSPITAL_COMMUNITY)
Admission: RE | Admit: 2020-10-17 | Discharge: 2020-10-17 | Disposition: A | Discharge status: Home / Self Care | Payer: Managed Care, Other (non HMO) | Attending: Surgery | Admitting: Surgery

## 2020-10-17 ENCOUNTER — Encounter (HOSPITAL_COMMUNITY)
Admission: RE | Disposition: A | Discharge status: Home / Self Care | Payer: Self-pay | Source: Home / Self Care | Attending: Surgery

## 2020-10-17 ENCOUNTER — Other Ambulatory Visit: Payer: Self-pay

## 2020-10-17 ENCOUNTER — Other Ambulatory Visit (HOSPITAL_COMMUNITY): Payer: Self-pay | Admitting: Surgery

## 2020-10-17 ENCOUNTER — Ambulatory Visit (HOSPITAL_COMMUNITY): Payer: Managed Care, Other (non HMO) | Admitting: Anesthesiology

## 2020-10-17 DIAGNOSIS — Z791 Long term (current) use of non-steroidal anti-inflammatories (NSAID): Secondary | ICD-10-CM | POA: Insufficient documentation

## 2020-10-17 DIAGNOSIS — Z8349 Family history of other endocrine, nutritional and metabolic diseases: Secondary | ICD-10-CM | POA: Diagnosis not present

## 2020-10-17 DIAGNOSIS — K8010 Calculus of gallbladder with chronic cholecystitis without obstruction: Secondary | ICD-10-CM | POA: Insufficient documentation

## 2020-10-17 DIAGNOSIS — Z8249 Family history of ischemic heart disease and other diseases of the circulatory system: Secondary | ICD-10-CM | POA: Insufficient documentation

## 2020-10-17 DIAGNOSIS — K824 Cholesterolosis of gallbladder: Secondary | ICD-10-CM | POA: Diagnosis present

## 2020-10-17 HISTORY — PX: CHOLECYSTECTOMY: SHX55

## 2020-10-17 LAB — CBC
HCT: 45.7 % (ref 39.0–52.0)
Hemoglobin: 16 g/dL (ref 13.0–17.0)
MCH: 29 pg (ref 26.0–34.0)
MCHC: 35 g/dL (ref 30.0–36.0)
MCV: 82.8 fL (ref 80.0–100.0)
Platelets: 216 10*3/uL (ref 150–400)
RBC: 5.52 MIL/uL (ref 4.22–5.81)
RDW: 11.9 % (ref 11.5–15.5)
WBC: 6.9 10*3/uL (ref 4.0–10.5)
nRBC: 0 % (ref 0.0–0.2)

## 2020-10-17 SURGERY — LAPAROSCOPIC CHOLECYSTECTOMY
Anesthesia: General | Site: Abdomen

## 2020-10-17 MED ORDER — OXYCODONE HCL 5 MG PO TABS: 5.0000 mg | ORAL_TABLET | Freq: Once | ORAL | Status: DC | PRN

## 2020-10-17 MED ORDER — PROMETHAZINE HCL 25 MG/ML IJ SOLN: 6.2500 mg | INTRAMUSCULAR | Status: DC | PRN

## 2020-10-17 MED ORDER — ROCURONIUM BROMIDE 10 MG/ML (PF) SYRINGE
PREFILLED_SYRINGE | INTRAVENOUS | Status: AC
  Filled 2020-10-17: qty 10

## 2020-10-17 MED ORDER — SODIUM CHLORIDE 0.9 % IR SOLN
Status: DC | PRN
  Administered 2020-10-17: 1000 mL

## 2020-10-17 MED ORDER — GABAPENTIN 300 MG PO CAPS
ORAL_CAPSULE | ORAL | Status: AC
  Administered 2020-10-17: 300 mg via ORAL
  Filled 2020-10-17: qty 1

## 2020-10-17 MED ORDER — BUPIVACAINE HCL (PF) 0.25 % IJ SOLN
INTRAMUSCULAR | Status: AC
  Filled 2020-10-17: qty 30

## 2020-10-17 MED ORDER — GABAPENTIN 300 MG PO CAPS: 300.0000 mg | ORAL_CAPSULE | ORAL | Status: AC

## 2020-10-17 MED ORDER — BUPIVACAINE HCL 0.25 % IJ SOLN
INTRAMUSCULAR | Status: DC | PRN
  Administered 2020-10-17: 26 mL

## 2020-10-17 MED ORDER — FENTANYL CITRATE (PF) 250 MCG/5ML IJ SOLN
INTRAMUSCULAR | Status: DC | PRN
  Administered 2020-10-17: 100 ug via INTRAVENOUS

## 2020-10-17 MED ORDER — CEFAZOLIN SODIUM-DEXTROSE 2-4 GM/100ML-% IV SOLN
2.0000 g | INTRAVENOUS | Status: AC
  Administered 2020-10-17: 2 g via INTRAVENOUS
  Filled 2020-10-17: qty 100

## 2020-10-17 MED ORDER — DEXAMETHASONE SODIUM PHOSPHATE 10 MG/ML IJ SOLN
INTRAMUSCULAR | Status: AC
  Filled 2020-10-17: qty 1

## 2020-10-17 MED ORDER — PROPOFOL 10 MG/ML IV BOLUS
INTRAVENOUS | Status: DC | PRN
  Administered 2020-10-17: 200 mg via INTRAVENOUS

## 2020-10-17 MED ORDER — 0.9 % SODIUM CHLORIDE (POUR BTL) OPTIME
TOPICAL | Status: DC | PRN
  Administered 2020-10-17: 1000 mL

## 2020-10-17 MED ORDER — LIDOCAINE 2% (20 MG/ML) 5 ML SYRINGE
INTRAMUSCULAR | Status: DC | PRN
  Administered 2020-10-17: 80 mg via INTRAVENOUS

## 2020-10-17 MED ORDER — LIDOCAINE 2% (20 MG/ML) 5 ML SYRINGE
INTRAMUSCULAR | Status: AC
  Filled 2020-10-17: qty 5

## 2020-10-17 MED ORDER — OXYCODONE HCL 5 MG/5ML PO SOLN
5.0000 mg | Freq: Once | ORAL | Status: DC | PRN
Start: 2020-10-17 — End: 2020-10-17

## 2020-10-17 MED ORDER — ONDANSETRON HCL 4 MG/2ML IJ SOLN
INTRAMUSCULAR | Status: DC | PRN
  Administered 2020-10-17: 4 mg via INTRAVENOUS

## 2020-10-17 MED ORDER — PHENYLEPHRINE 40 MCG/ML (10ML) SYRINGE FOR IV PUSH (FOR BLOOD PRESSURE SUPPORT)
PREFILLED_SYRINGE | INTRAVENOUS | Status: DC | PRN
  Administered 2020-10-17: 80 ug via INTRAVENOUS

## 2020-10-17 MED ORDER — PHENYLEPHRINE 40 MCG/ML (10ML) SYRINGE FOR IV PUSH (FOR BLOOD PRESSURE SUPPORT)
PREFILLED_SYRINGE | INTRAVENOUS | Status: AC
  Filled 2020-10-17: qty 10

## 2020-10-17 MED ORDER — OXYCODONE HCL 5 MG PO TABS: 5.0000 mg | ORAL_TABLET | Freq: Four times a day (QID) | ORAL | 0 refills | Status: DC | PRN

## 2020-10-17 MED ORDER — PROPOFOL 10 MG/ML IV BOLUS
INTRAVENOUS | Status: AC
  Filled 2020-10-17: qty 20

## 2020-10-17 MED ORDER — ORAL CARE MOUTH RINSE: 15.0000 mL | Freq: Once | OROMUCOSAL | Status: AC

## 2020-10-17 MED ORDER — SUGAMMADEX SODIUM 200 MG/2ML IV SOLN
INTRAVENOUS | Status: DC | PRN
  Administered 2020-10-17 (×2): 100 mg via INTRAVENOUS

## 2020-10-17 MED ORDER — MIDAZOLAM HCL 5 MG/5ML IJ SOLN
INTRAMUSCULAR | Status: DC | PRN
  Administered 2020-10-17: 2 mg via INTRAVENOUS

## 2020-10-17 MED ORDER — ROCURONIUM BROMIDE 10 MG/ML (PF) SYRINGE
PREFILLED_SYRINGE | INTRAVENOUS | Status: DC | PRN
  Administered 2020-10-17: 50 mg via INTRAVENOUS

## 2020-10-17 MED ORDER — MIDAZOLAM HCL 2 MG/2ML IJ SOLN
INTRAMUSCULAR | Status: AC
  Filled 2020-10-17: qty 2

## 2020-10-17 MED ORDER — ACETAMINOPHEN 500 MG PO TABS
ORAL_TABLET | ORAL | Status: AC
  Administered 2020-10-17: 1000 mg via ORAL
  Filled 2020-10-17: qty 2

## 2020-10-17 MED ORDER — CHLORHEXIDINE GLUCONATE 0.12 % MT SOLN
15.0000 mL | Freq: Once | OROMUCOSAL | Status: AC
  Administered 2020-10-17: 15 mL via OROMUCOSAL
  Filled 2020-10-17: qty 15

## 2020-10-17 MED ORDER — ACETAMINOPHEN 500 MG PO TABS: 1000.0000 mg | ORAL_TABLET | ORAL | Status: AC

## 2020-10-17 MED ORDER — ONDANSETRON HCL 4 MG/2ML IJ SOLN
INTRAMUSCULAR | Status: AC
  Filled 2020-10-17: qty 2

## 2020-10-17 MED ORDER — DEXAMETHASONE SODIUM PHOSPHATE 10 MG/ML IJ SOLN
INTRAMUSCULAR | Status: DC | PRN
  Administered 2020-10-17: 5 mg via INTRAVENOUS

## 2020-10-17 MED ORDER — LACTATED RINGERS IV SOLN: INTRAVENOUS | Status: DC

## 2020-10-17 MED ORDER — FENTANYL CITRATE (PF) 100 MCG/2ML IJ SOLN
25.0000 ug | INTRAMUSCULAR | Status: DC | PRN
Start: 2020-10-17 — End: 2020-10-17

## 2020-10-17 MED ORDER — FENTANYL CITRATE (PF) 250 MCG/5ML IJ SOLN
INTRAMUSCULAR | Status: AC
  Filled 2020-10-17: qty 5

## 2020-10-17 SURGICAL SUPPLY — 42 items
APPLIER CLIP 5 13 M/L LIGAMAX5 (MISCELLANEOUS) ×2
BLADE CLIPPER SURG (BLADE) IMPLANT
CANISTER SUCT 3000ML PPV (MISCELLANEOUS) ×2 IMPLANT
CHLORAPREP W/TINT 26 (MISCELLANEOUS) ×2 IMPLANT
CLIP APPLIE 5 13 M/L LIGAMAX5 (MISCELLANEOUS) ×1 IMPLANT
COVER SURGICAL LIGHT HANDLE (MISCELLANEOUS) ×2 IMPLANT
COVER WAND RF STERILE (DRAPES) ×2 IMPLANT
DERMABOND ADVANCED (GAUZE/BANDAGES/DRESSINGS) ×1
DERMABOND ADVANCED .7 DNX12 (GAUZE/BANDAGES/DRESSINGS) ×1 IMPLANT
ELECT REM PT RETURN 9FT ADLT (ELECTROSURGICAL) ×2
ELECTRODE REM PT RTRN 9FT ADLT (ELECTROSURGICAL) ×1 IMPLANT
GLOVE BIOGEL PI IND STRL 6 (GLOVE) ×1 IMPLANT
GLOVE BIOGEL PI INDICATOR 6 (GLOVE) ×1
GLOVE BIOGEL PI MICRO 5.5 (GLOVE) ×1
GLOVE BIOGEL PI MICRO STRL 5.5 (GLOVE) ×1 IMPLANT
GOWN STRL REUS W/ TWL LRG LVL3 (GOWN DISPOSABLE) ×3 IMPLANT
GOWN STRL REUS W/TWL LRG LVL3 (GOWN DISPOSABLE) ×3
KIT BASIN OR (CUSTOM PROCEDURE TRAY) ×2 IMPLANT
KIT TURNOVER KIT B (KITS) ×2 IMPLANT
L-HOOK LAP DISP 36CM (ELECTROSURGICAL) ×2
LHOOK LAP DISP 36CM (ELECTROSURGICAL) ×1 IMPLANT
NEEDLE INSUFFLATION 14GA 120MM (NEEDLE) IMPLANT
NS IRRIG 1000ML POUR BTL (IV SOLUTION) ×2 IMPLANT
PAD ARMBOARD 7.5X6 YLW CONV (MISCELLANEOUS) ×2 IMPLANT
PENCIL BUTTON HOLSTER BLD 10FT (ELECTRODE) ×2 IMPLANT
POUCH SPECIMEN RETRIEVAL 10MM (ENDOMECHANICALS) ×2 IMPLANT
SCISSORS LAP 5X35 DISP (ENDOMECHANICALS) ×2 IMPLANT
SET IRRIG TUBING LAPAROSCOPIC (IRRIGATION / IRRIGATOR) ×2 IMPLANT
SET TUBE SMOKE EVAC HIGH FLOW (TUBING) ×2 IMPLANT
SLEEVE ENDOPATH XCEL 5M (ENDOMECHANICALS) ×4 IMPLANT
SPECIMEN JAR SMALL (MISCELLANEOUS) ×2 IMPLANT
SUT MNCRL AB 4-0 PS2 18 (SUTURE) ×2 IMPLANT
SUT VIC AB 3-0 SH 27 (SUTURE)
SUT VIC AB 3-0 SH 27XBRD (SUTURE) IMPLANT
SUT VICRYL 0 UR6 27IN ABS (SUTURE) ×2 IMPLANT
TOWEL GREEN STERILE (TOWEL DISPOSABLE) ×2 IMPLANT
TOWEL GREEN STERILE FF (TOWEL DISPOSABLE) ×2 IMPLANT
TRAY LAPAROSCOPIC MC (CUSTOM PROCEDURE TRAY) ×2 IMPLANT
TROCAR XCEL 12X100 BLDLESS (ENDOMECHANICALS) IMPLANT
TROCAR XCEL BLUNT TIP 100MML (ENDOMECHANICALS) ×2 IMPLANT
TROCAR XCEL NON-BLD 5MMX100MML (ENDOMECHANICALS) ×2 IMPLANT
WATER STERILE IRR 1000ML POUR (IV SOLUTION) ×2 IMPLANT

## 2020-10-17 NOTE — Anesthesia Procedure Notes (Addendum)
Procedure Name: Intubation Date/Time: 10/17/2020 7:40 AM Performed by: Trinna Post., CRNA Pre-anesthesia Checklist: Patient identified, Emergency Drugs available, Suction available, Patient being monitored and Timeout performed Patient Re-evaluated:Patient Re-evaluated prior to induction Oxygen Delivery Method: Circle system utilized Preoxygenation: Pre-oxygenation with 100% oxygen Induction Type: IV induction Ventilation: Mask ventilation without difficulty Laryngoscope Size: Mac and 4 Grade View: Grade I Tube type: Oral Tube size: 7.5 mm Number of attempts: 1 Airway Equipment and Method: Stylet Placement Confirmation: ETT inserted through vocal cords under direct vision,  positive ETCO2 and breath sounds checked- equal and bilateral Secured at: 22 cm Tube secured with: Tape Dental Injury: Teeth and Oropharynx as per pre-operative assessment  Comments: EMT student, [place]

## 2020-10-17 NOTE — Op Note (Signed)
Date: 10/17/20  Patient: Eric Barrera MRN: [id number]  Preoperative Diagnosis: Gallbladder polyps Postoperative Diagnosis: Same  Procedure: Laparoscopic cholecystectomy  Surgeon: Sophronia Simas, MD  EBL: Minimal  Anesthesia: General  Specimens: Gallbladder  Indications: [name] is a 35 yo male who presented with gallbladder polyps, which mildly enlarged on surveillance imaging. After a discussion regarding cholecystectomy vs continued close surveillance, he elected to proceed with cholecystectomy.  Findings: Benign appearance of gallbladder. No evidence of cholecystitis.  Procedure details: Informed consent was obtained in the preoperative area prior to the procedure. The patient was brought to the operating room and placed on the table in the supine position. General anesthesia was induced and appropriate lines and drains were placed for intraoperative monitoring. Perioperative antibiotics were administered per SCIP guidelines. The abdomen was prepped and draped in the usual sterile fashion. A pre-procedure timeout was taken verifying patient identity, surgical site and procedure to be performed.  A small infraumbilical skin incision was made, the subcutaneous tissue was divided with cautery, and the umbilical stalk was grasped and elevated. The fascia was incised and the peritoneal cavity was directly visualized. A 70mm Hassan trocar was placed. The peritoneal cavity was inspected with no evidence of visceral or vascular injury. Three 30mm ports were placed in the right subcostal margin, all under direct visualization. The fundus of the gallbladder was grasped and retracted cephalad. The infundibulum was retracted laterally. The cystic triangle was dissected out using cautery and blunt dissection, and the critical view of safety was obtained. The cystic duct and cystic artery were clipped and ligated. The gallbladder was taken off the liver using cautery. The specimen was placed in an  endocatch bag and removed. The surgical site was irrigated with saline until the effluent was clear. Hemostasis was achieved in the gallbladder fossa using cautery. The cystic duct and artery stumps were visually inspected and there was no evidence of bile leak or bleeding. The ports were removed under direct visualization and the abdomen was desufflated. The umbilical port site fascia was closed with a 0 vicryl suture. The skin at all port sites was closed with 4-0 monocryl subcuticular suture. Dermabond was applied.  The patient tolerated the procedure well with no apparent complications. All counts were correct x2 at the end of the procedure. The patient was extubated and taken to PACU in stable condition.  Sophronia Simas, MD 10/17/20 8:23 AM

## 2020-10-17 NOTE — H&P (Signed)
Eric Barrera is an 35 y.o. male.    HPI: Eric Barrera is a 35 yo male who was referred for evaluation of gallbladder polyps. He was incidentally noted to have a mild elevation in ALT in 2017, which has been followed by his PCP and Dr. Adela Lank at [facility]. His ALT continued to rise and in November 2019 he had a RUQ [place], which showed several gallbladder polyps, the largest of which measured 3.70mm. He has undergone annual surveillance of the polyps with ultrasounds. His 2021 US showed a 23mm polyp, and his most recently US showed that the largest polyp was 5.20mm. He was referred to me for surgical consultation. The patient denies any abdominal pain. No jaundice. He has no family history of of gallbladder or liver cancer. His ALT has normalized and bilirubin remains normal. He presents today for cholecystectomy. COVID negative on 3/15.  Past Medical History:  Diagnosis Date  . Elevated ALT measurement   . Gallbladder polyp   . Hyperlipidemia     Past Surgical History:  Procedure Laterality Date  . WISDOM TOOTH EXTRACTION      Family History  Problem Relation Age of Onset  . Hypertension Mother   . Hyperlipidemia Father    Social History:  reports that he has never smoked. He has never used smokeless tobacco. He reports current alcohol use. He reports that he does not use drugs.  Allergies: No Known Allergies  Medications Prior to Admission  Medication Sig Dispense Refill  . acetaminophen (TYLENOL) 500 MG tablet Take 1,000 mg by mouth every 6 (six) hours as needed for headache.    . ibuprofen (ADVIL) 200 MG tablet Take 400-600 mg by mouth every 6 (six) hours as needed for headache (Muscle pain).      Results for orders placed or performed during the hospital encounter of 10/17/20 (from the past 48 hour(s))  CBC per protocol     Status: None   Collection Time: 10/17/20  6:26 AM  Result Value Ref Range   WBC 6.9 4.0 - 10.5 K/uL   RBC 5.52 4.22 - 5.81 MIL/uL   Hemoglobin 16.0 13.0 -  17.0 g/dL   HCT 08.6 57.8 - 46.9 %   MCV 82.8 80.0 - 100.0 fL   MCH 29.0 26.0 - 34.0 pg   MCHC 35.0 30.0 - 36.0 g/dL   RDW 62.9 52.8 - 41.3 %   Platelets 216 150 - 400 K/uL   nRBC 0.0 0.0 - 0.2 %    Comment: Performed at [facility], [address], [place], [place] [address]   No results found.  Review of Systems  Blood pressure (!) 143/84, pulse 78, temperature 98.1 F (36.7 C), temperature source Oral, resp. rate 16, height 5\' 11"  (1.803 m), weight 74.8 kg, SpO2 100 %. Physical Exam Constitutional:      General: He is not in acute distress.    Appearance: Normal appearance.  HENT:     Head: Normocephalic and atraumatic.  Eyes:     General: No scleral icterus.    Conjunctiva/sclera: Conjunctivae normal.  Pulmonary:     Effort: Pulmonary effort is normal. No respiratory distress.  Abdominal:     General: There is no distension.     Palpations: Abdomen is soft.  Musculoskeletal:        General: Normal range of motion.     Cervical back: Normal range of motion.  Skin:    General: Skin is warm and dry.     Coloration:  Skin is not jaundiced.  Neurological:     General: No focal deficit present.     Mental Status: He is alert and oriented to person, place, and time.  Psychiatric:        Mood and Affect: Mood normal.        Behavior: Behavior normal.        Thought Content: Thought content normal.      Assessment/Plan 35 yo male with gallbladder polyps. Proceed to OR for lap cholecystectomy. All questions answered, informed consent obtained. Plan for discharge home from PACU.  Fritzi Mandes, MD 10/17/2020, 6:56 AM

## 2020-10-17 NOTE — Transfer of Care (Signed)
Immediate Anesthesia Transfer of Care Note  Patient: Eric Barrera  Procedure(s) Performed: LAPAROSCOPIC CHOLECYSTECTOMY (N/A Abdomen)  Patient Location: PACU  Anesthesia Type:General  Level of Consciousness: awake, alert  and oriented  Airway & Oxygen Therapy: Patient Spontanous Breathing and Patient connected to nasal cannula oxygen  Post-op Assessment: Report given to RN and Post -op Vital signs reviewed and stable  Post vital signs: Reviewed and stable  Last Vitals:  Vitals Value Taken Time  BP 141/75 10/17/20 0828  Temp    Pulse 102 10/17/20 0828  Resp    SpO2 97 % 10/17/20 0828  Vitals shown include unvalidated device data.  Last Pain:  Vitals:   10/17/20 0557  TempSrc: Oral  PainSc: 0-No pain      Patients Stated Pain Goal: 0 (10/17/20 0557)  Complications: No complications documented.

## 2020-10-17 NOTE — Discharge Instructions (Signed)
[facility] [place] [facility] DISCHARGE INSTRUCTIONS  Activity . No heavy lifting greater than 10 pounds for 4 weeks after surgery. Marland Kitchen Ok to shower in 24 hours after surgery, but do not bathe or submerge incisions underwater. . Do not drive while taking narcotic pain medication.  Wound Care . Your incisions are covered with skin glue called Dermabond. This will peel off on its own over time. . You may shower and allow warm soapy water to run over your incisions. Gently pat dry. . Do not submerge your incision underwater. . Monitor your incision for any new redness, tenderness, or drainage.  When to Call us: Marland Kitchen Fever greater than 100.5 . New redness, drainage, or swelling at incision site . Severe pain, nausea, or vomiting . Jaundice (yellowing of the whites of the eyes or skin)  Follow-up You have an appointment scheduled with Dr. Freida Busman on November 05, 2020 at 10:30am. This will be at the [facility] office at [address], [place], [place]. Please arrive at least 15 minutes prior to your scheduled appointment time.  For questions or concerns, please call the office at [phone].

## 2020-10-20 ENCOUNTER — Encounter (HOSPITAL_COMMUNITY): Payer: Self-pay | Admitting: Surgery

## 2020-10-20 LAB — SURGICAL PATHOLOGY

## 2020-10-21 ENCOUNTER — Encounter (HOSPITAL_COMMUNITY): Payer: Self-pay | Admitting: Anesthesiology

## 2020-10-27 NOTE — Anesthesia Postprocedure Evaluation (Signed)
Anesthesia Post Note  Patient: Eric Barrera  Procedure(s) Performed: LAPAROSCOPIC CHOLECYSTECTOMY (N/A Abdomen)     Patient location during evaluation: PACU Anesthesia Type: General Level of consciousness: awake and alert Pain management: pain level controlled Vital Signs Assessment: post-procedure vital signs reviewed and stable Respiratory status: spontaneous breathing, nonlabored ventilation and respiratory function stable Cardiovascular status: blood pressure returned to baseline and stable Postop Assessment: no apparent nausea or vomiting Anesthetic complications: no   No complications documented.  Last Vitals:  Vitals:   10/17/20 0830 10/17/20 0856  BP: (!) 141/75   Pulse: 99   Resp:    Temp:  36.7 C  SpO2: 98%     Last Pain:  Vitals:   10/17/20 0828  TempSrc:   PainSc: 0-No pain                 Beryle Lathe

## 2020-10-31 IMAGING — US US ABDOMEN LIMITED
1 series · 14 of 25 positions shown · non-contrast
Comparison: None.

CLINICAL DATA: Increased liver enzymes

EXAM:
ULTRASOUND ABDOMEN LIMITED RIGHT UPPER QUADRANT

[Series 1: us abdomen limited · 0.20mm/px · 14 of 55 slices shown]
[im 1/55]
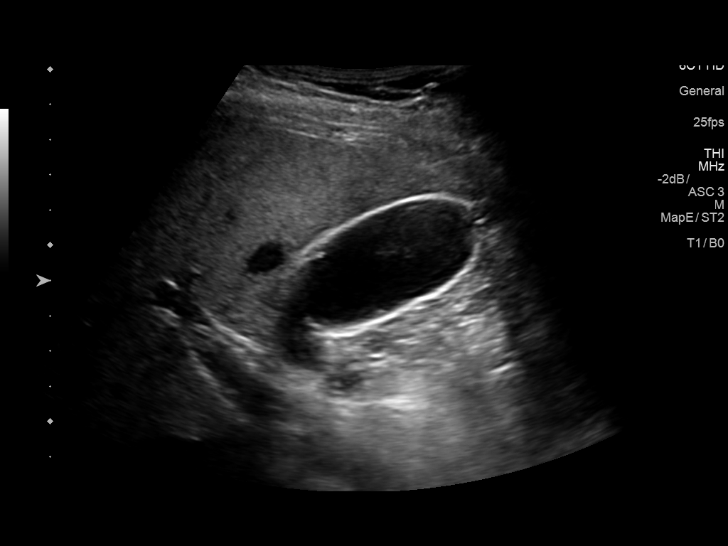
[im 5/55]
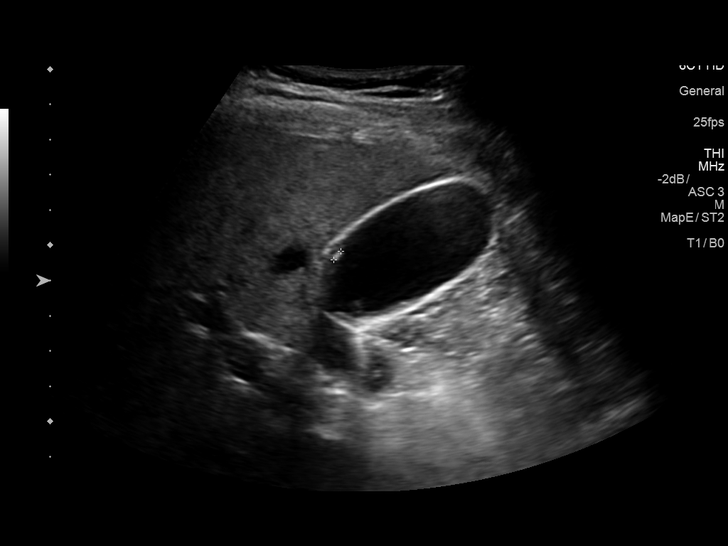
[im 10/55]
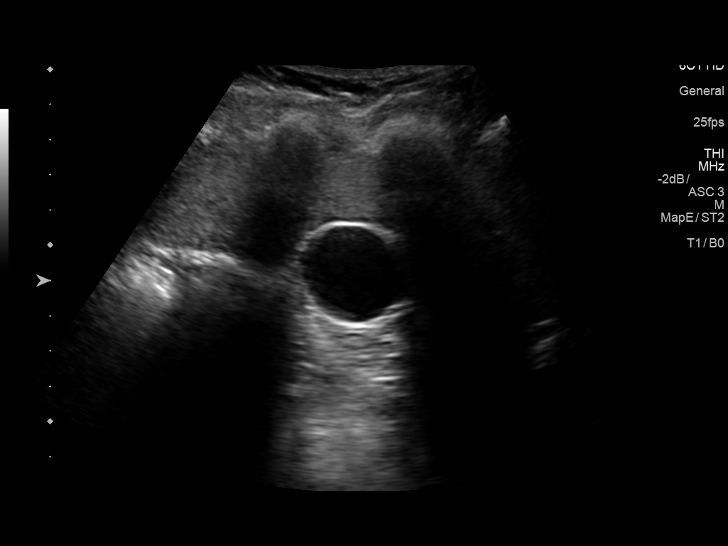
[im 14/55]
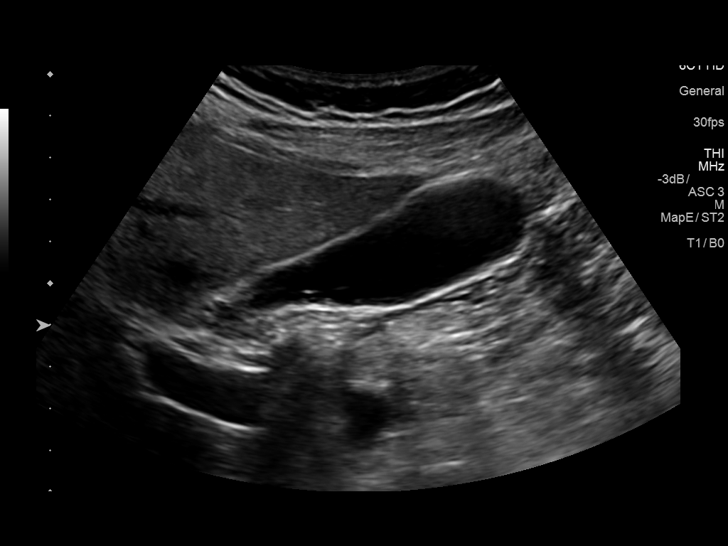
[im 19/55]
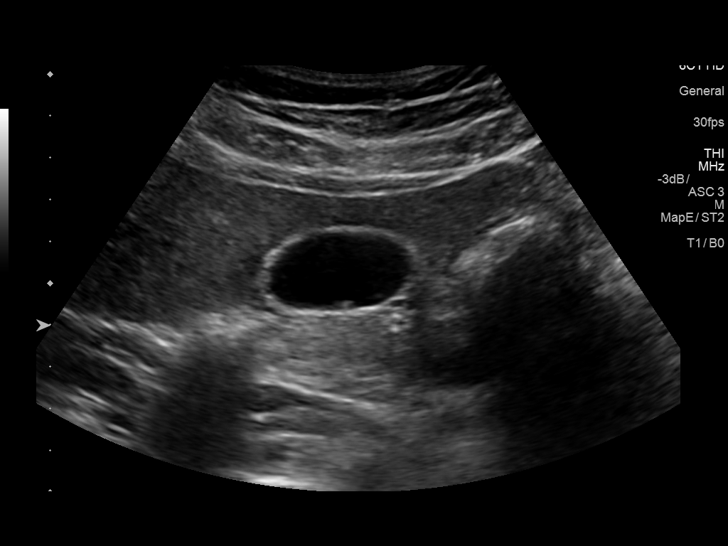
[im 21/55]
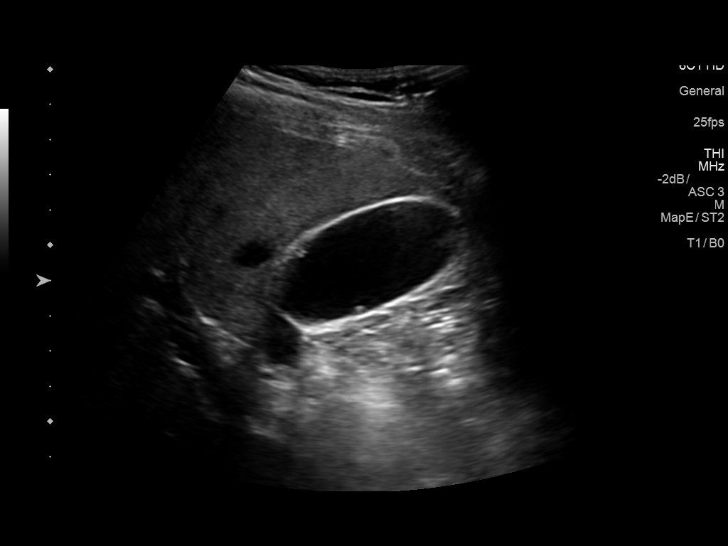
[im 25/55]
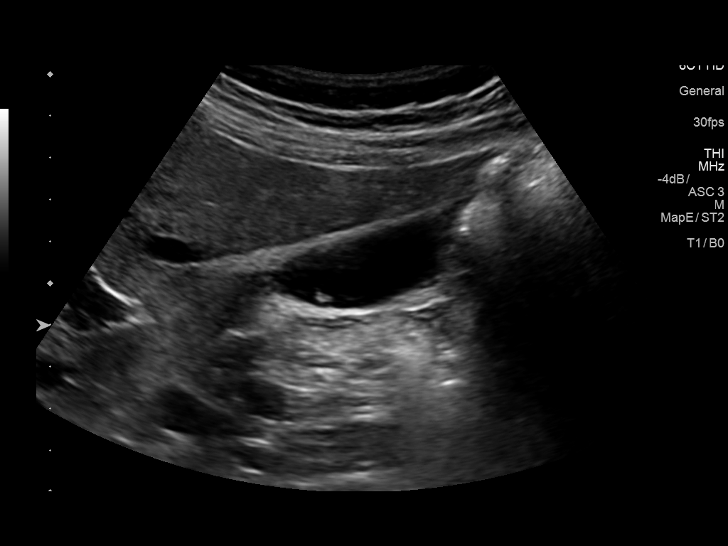
[im 30/55]
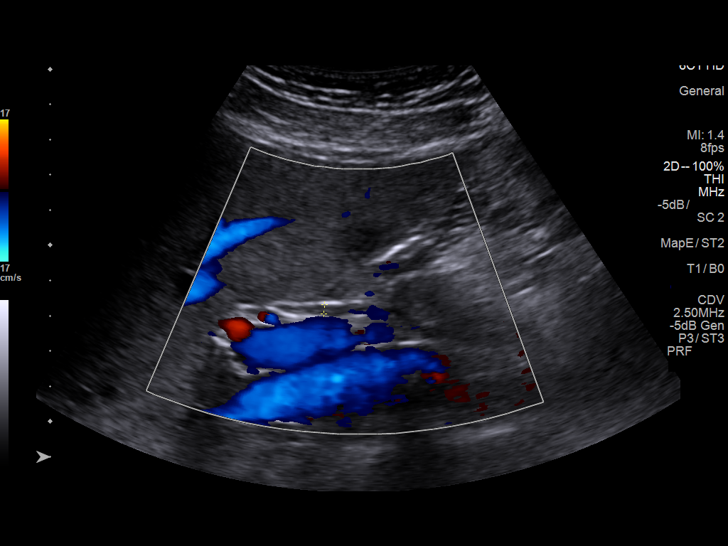
[im 34/55]
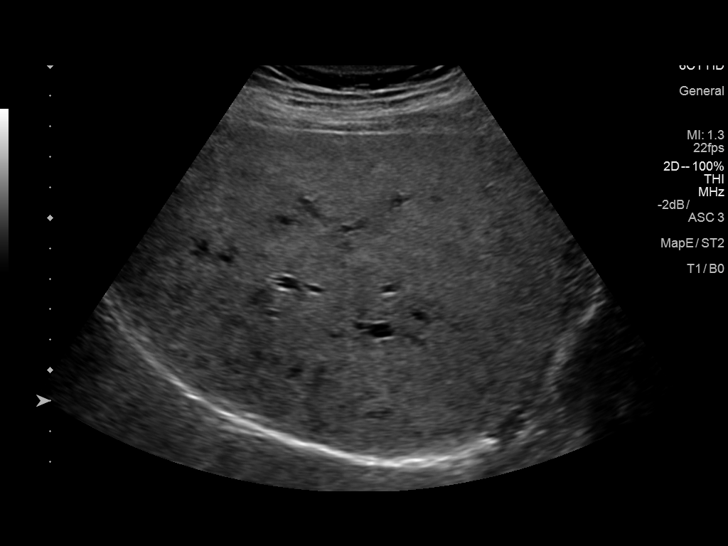
[im 37/55]
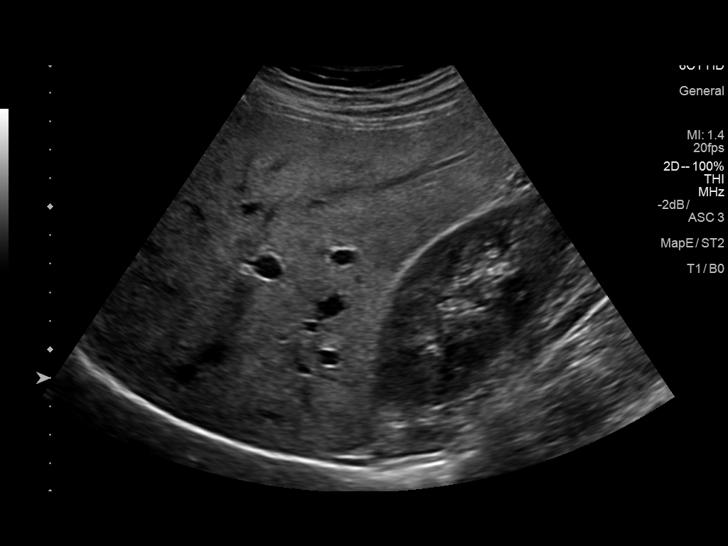
[im 41/55]
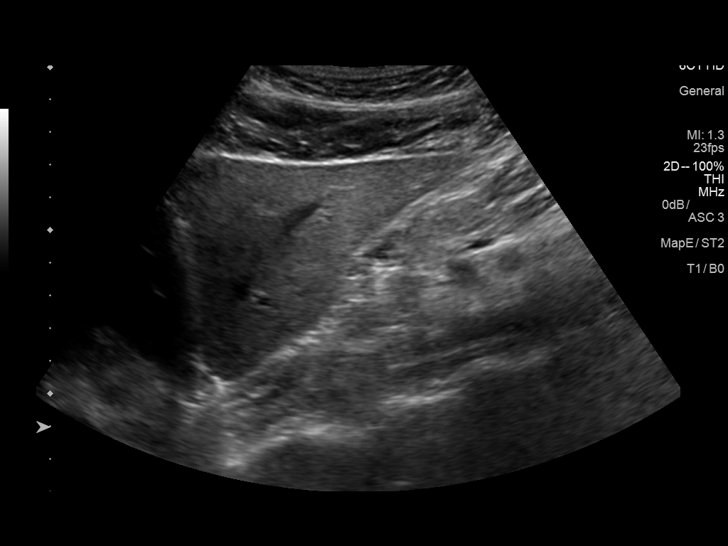
[im 46/55]
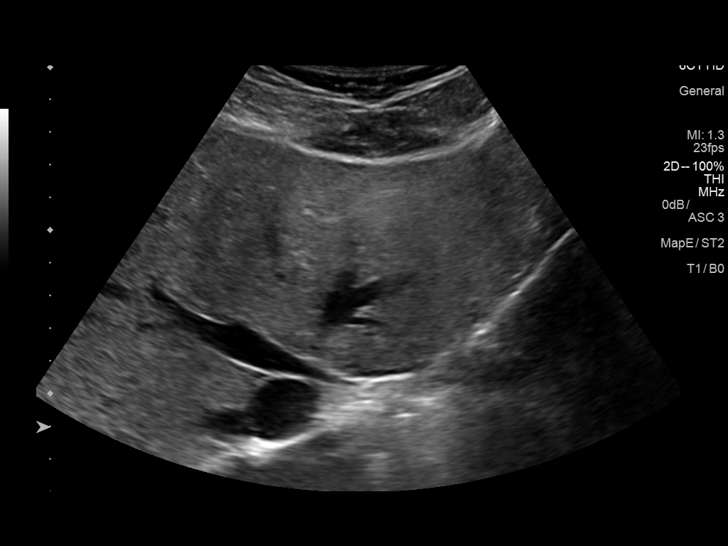
[im 50/55]
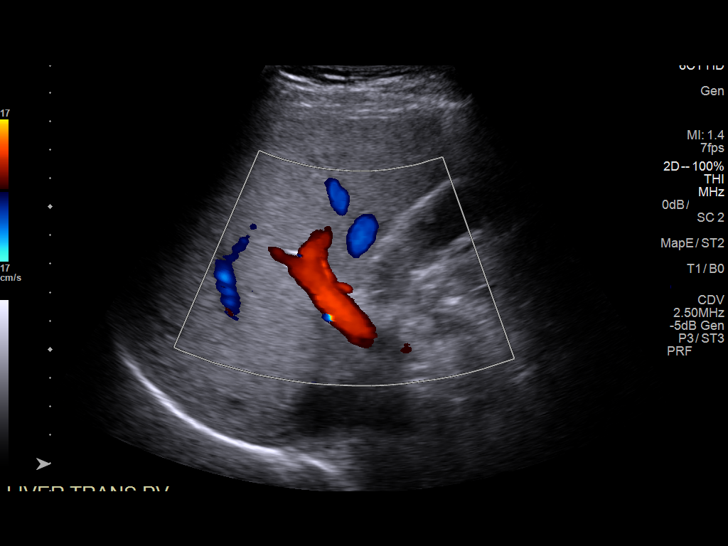
[im 55/55]
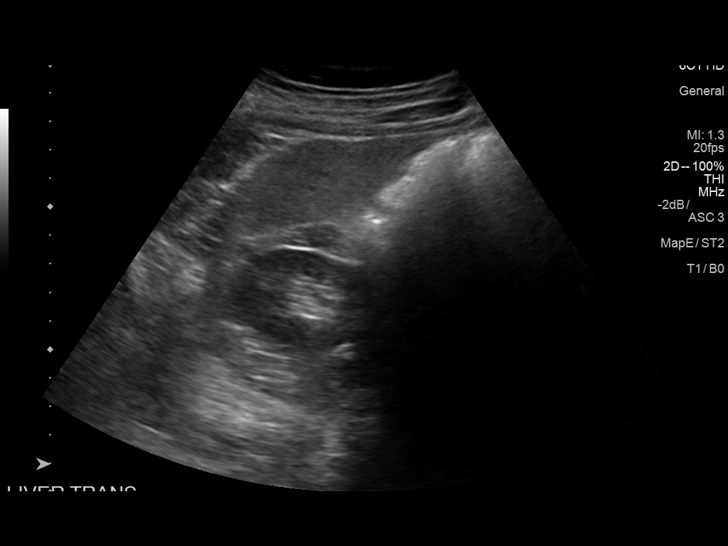

[14 of 25 positions shown; findings below may reference images not displayed]

FINDINGS: Gallbladder:

No gallstones or wall thickening visualized. There is a 3.4 mm polyp
in the gallbladder. No sonographic Murphy sign noted by sonographer.

Common bile duct:

Diameter: 2.6 mm

Liver:

No focal lesion identified. Diffuse increased echotexture of the
liver is identified. Portal vein is patent on color Doppler imaging
with normal direction of blood flow towards the liver.
IMPRESSION: Increased echotexture of the liver, nonspecific but can be seen in
fatty infiltration of liver.

Small gallbladder polyp.

## 2021-03-03 ENCOUNTER — Telehealth: Payer: Self-pay

## 2021-03-03 DIAGNOSIS — K824 Cholesterolosis of gallbladder: Secondary | ICD-10-CM

## 2021-03-03 DIAGNOSIS — K760 Fatty (change of) liver, not elsewhere classified: Secondary | ICD-10-CM

## 2021-03-03 NOTE — Telephone Encounter (Signed)
-----   Message from Missy Sabins, RN sent at 08/25/2020  1:04 PM EST ----- Regarding: [place] RUQ US - gallbladder polyps, fatty liver

## 2021-03-03 NOTE — Telephone Encounter (Signed)
Lm on vm for patient to return call.  RUQ US order in epic. 

## 2021-03-06 NOTE — Telephone Encounter (Signed)
Lm on vm for patient to return call 

## 2021-03-09 NOTE — Telephone Encounter (Signed)
Lm on vm for patient to return call 

## 2021-03-11 NOTE — Telephone Encounter (Signed)
No return call received, will mail letter. 

## 2021-04-06 IMAGING — US US ABDOMEN LIMITED
1 series · 14 of 25 positions shown · non-contrast
Comparison: Abdominal ultrasound August 17, 2019 and June 09, 2018

CLINICAL DATA: Follow-up gallbladder polyps

EXAM:
ULTRASOUND ABDOMEN LIMITED RIGHT UPPER QUADRANT

[Series 1: us abdomen limited · 14 of 54 slices shown]
[im 1/54]
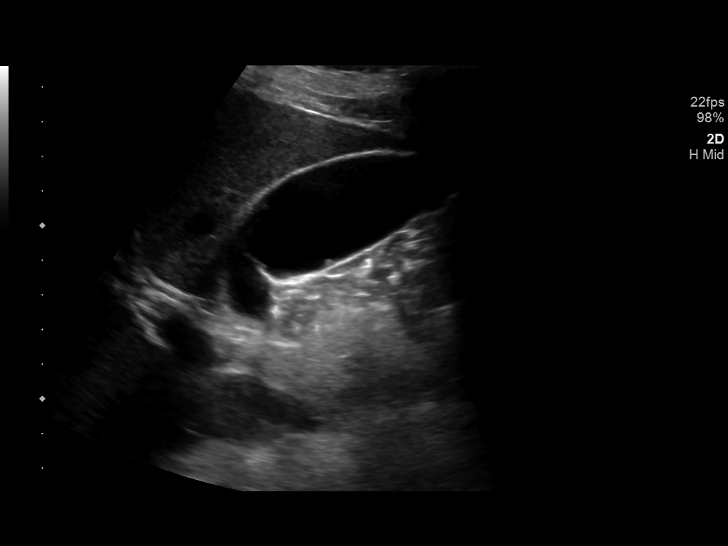
[im 5/54]
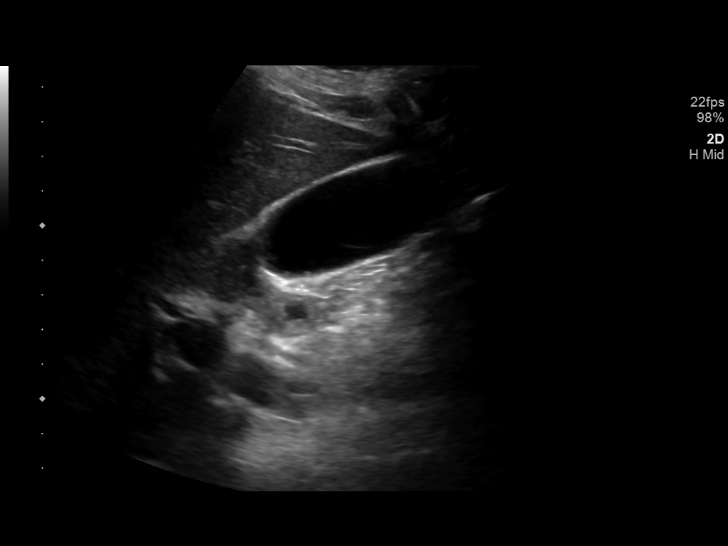
[im 9/54]
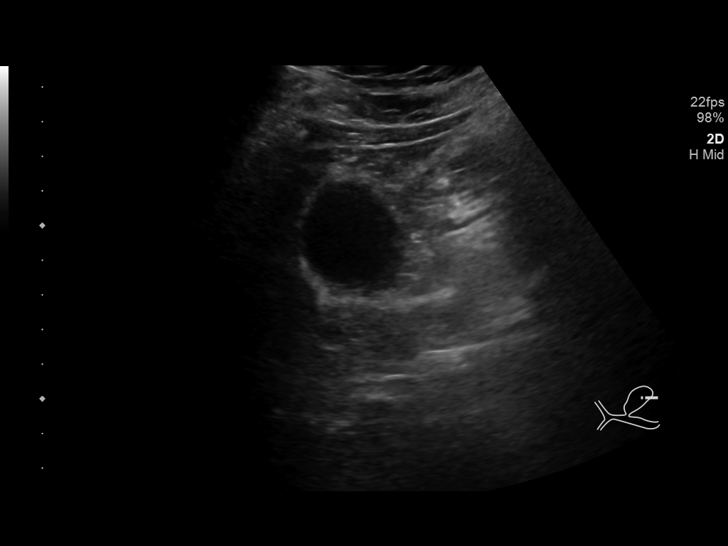
[im 14/54]
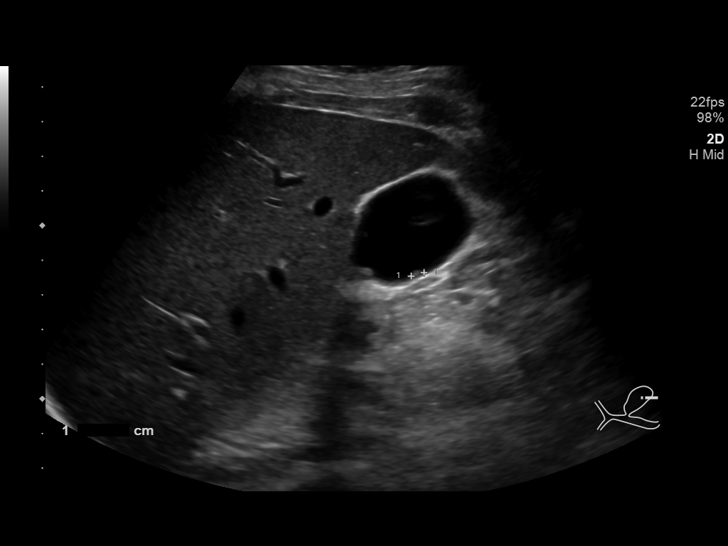
[im 18/54]
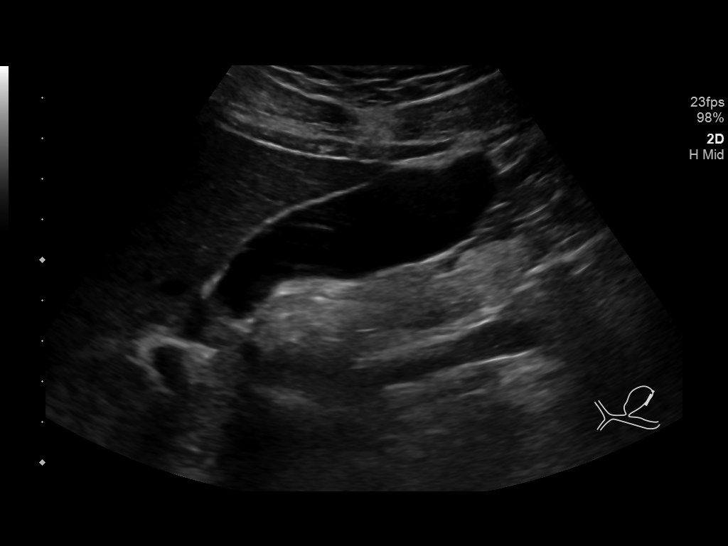
[im 20/54]
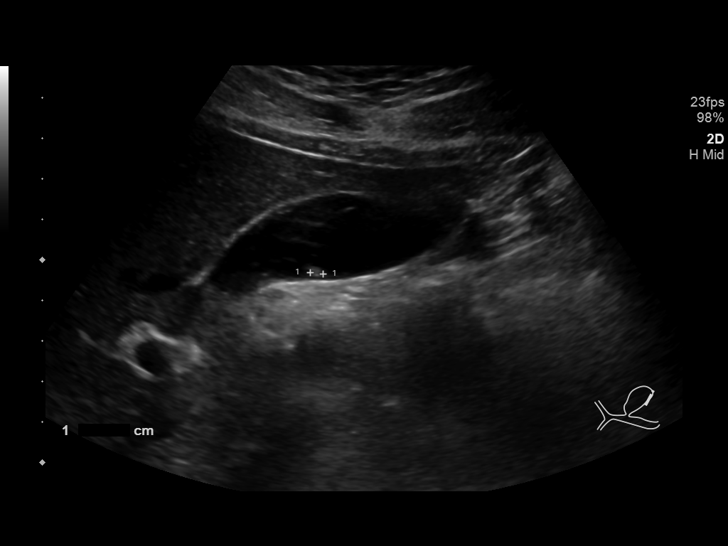
[im 25/54]
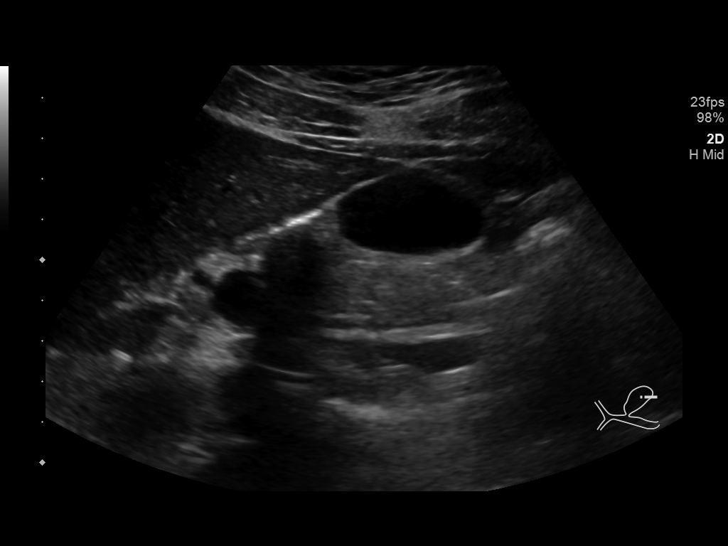
[im 29/54]
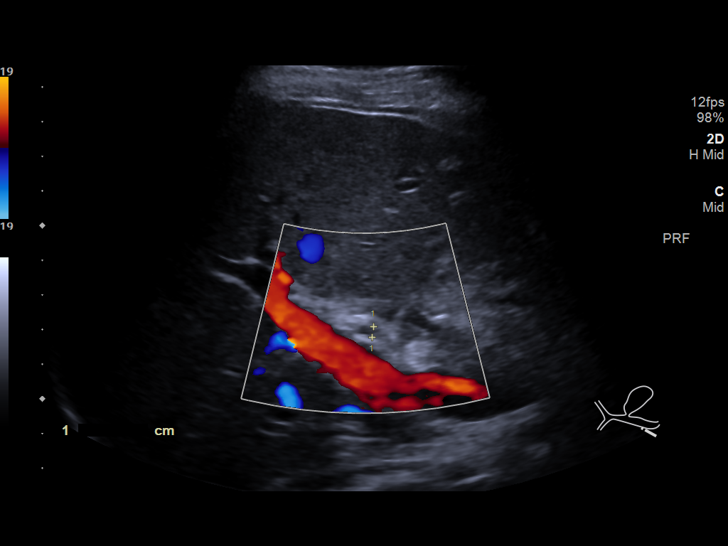
[im 34/54]
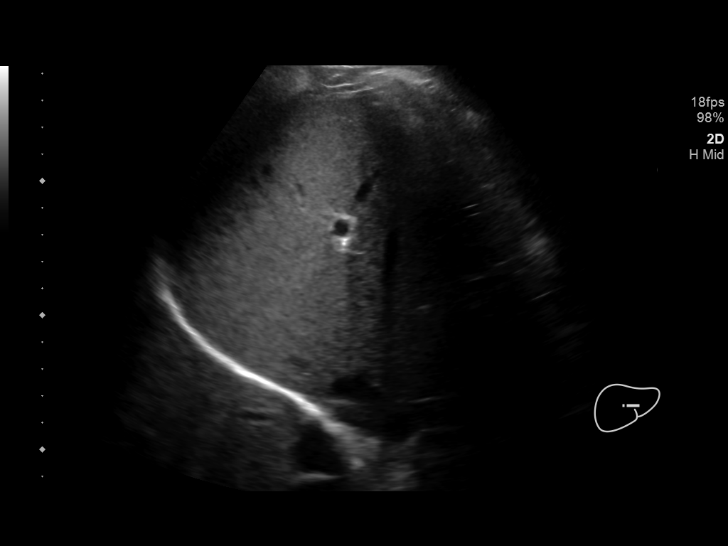
[im 36/54]
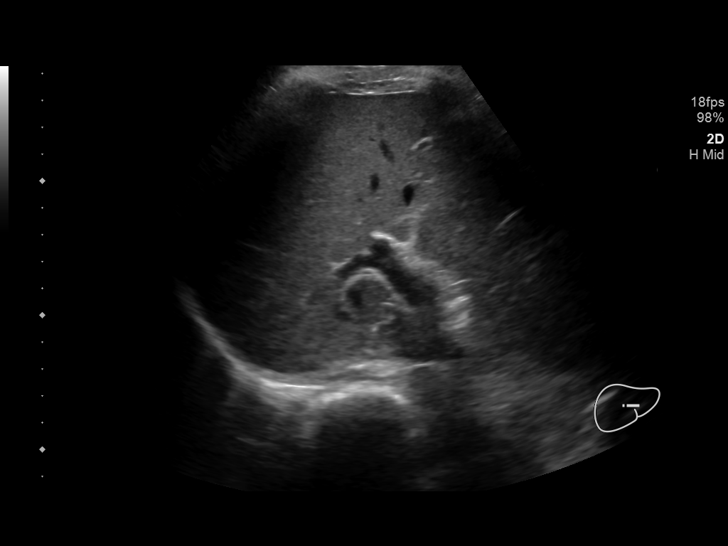
[im 40/54]
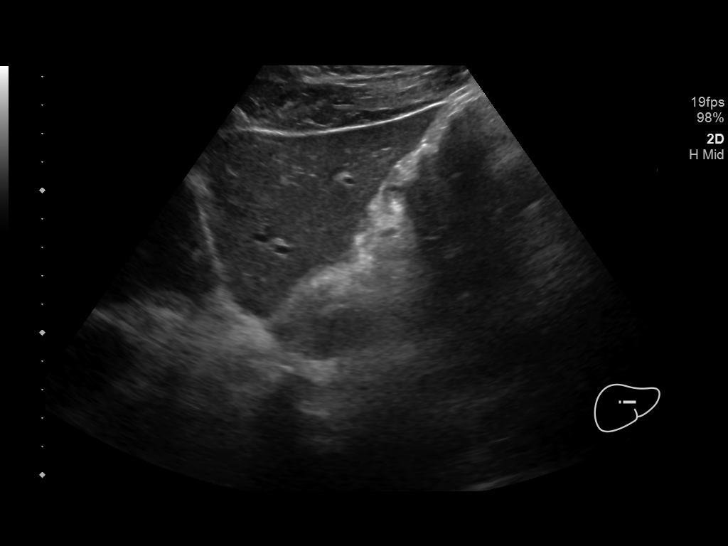
[im 45/54]
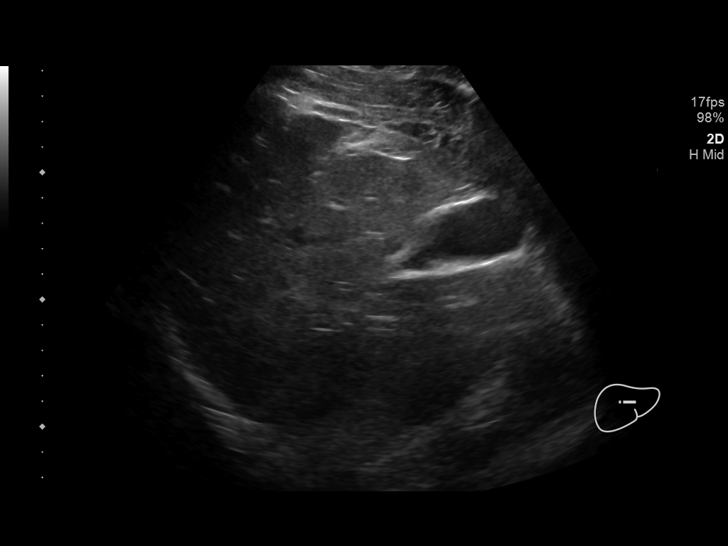
[im 49/54]
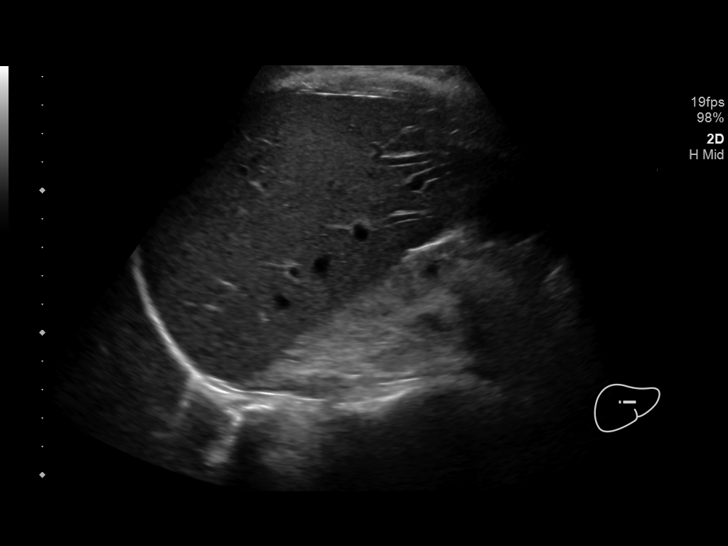
[im 54/54]
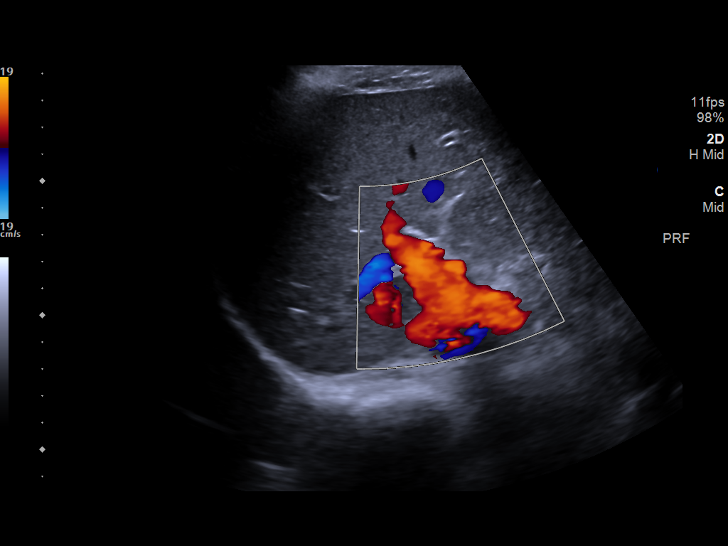

[14 of 25 positions shown; findings below may reference images not displayed]

FINDINGS: Gallbladder:

There are 2 gallbladder polyps 1 of which is not visualized on prior
imaging and the other of which is slightly larger in comparison to
prior. The polyp which has slightly increased in size now measures
5.6 mm previously 4.5 mm. The gallbladder polyp which was not
visualized on prior imaging measures 4 mm. No gallstones or wall
thickening visualized. No sonographic Murphy sign noted by
sonographer.

Common bile duct:

Diameter: 2 mm

Liver:

No focal lesion identified. Within normal limits in parenchymal
echogenicity. Portal vein is patent on color Doppler imaging with
normal direction of blood flow towards the liver.

Other: None.
IMPRESSION: Two gallbladder polyps, 4 mm polyp which was not visualized on prior
imaging and a 6 mm polyp which is slightly larger in comparison to
prior. Recommend follow-up ultrasound in 6 months.

## 2021-07-01 ENCOUNTER — Encounter: Payer: Self-pay | Admitting: Family Medicine

## 2021-07-01 ENCOUNTER — Ambulatory Visit (INDEPENDENT_AMBULATORY_CARE_PROVIDER_SITE_OTHER): Payer: Managed Care, Other (non HMO) | Admitting: Family Medicine

## 2021-07-01 VITALS — BP 102/72 | HR 57 | Temp 97.9°F | Ht 71.0 in | Wt 170.0 lb

## 2021-07-01 DIAGNOSIS — Z23 Encounter for immunization: Secondary | ICD-10-CM | POA: Diagnosis not present

## 2021-07-01 DIAGNOSIS — Z0000 Encounter for general adult medical examination without abnormal findings: Secondary | ICD-10-CM | POA: Diagnosis not present

## 2021-07-01 NOTE — Progress Notes (Signed)
Chief Complaint  Patient presents with   New Patient (Initial Visit)    Well Male Eric Barrera is here for a complete physical.   His last physical was >1 year ago.  Current diet: in general, diet could be better.   Current exercise: cycling, lifting Weight trend: up a few lbs Fatigue out of ordinary? No. Seat belt? Yes.    Health maintenance Tetanus- Yes HIV- Yes Hep C- Yes  Past Medical History:  Diagnosis Date   Hyperlipidemia      Past Surgical History:  Procedure Laterality Date   CHOLECYSTECTOMY N/A 10/17/2020   Procedure: LAPAROSCOPIC CHOLECYSTECTOMY;  Surgeon: Fritzi Mandes, MD;  Location: [facility] OR;  Service: General;  Laterality: N/A;   WISDOM TOOTH EXTRACTION      Medications  Takes no meds routinely.    Allergies No Known Allergies  Family History Family History  Problem Relation Age of Onset   Hypertension Mother    Hyperlipidemia Father     Review of Systems: Constitutional: no fevers or chills Eye:  no recent significant change in vision Ear/Nose/Mouth/Throat:  Ears:  no hearing loss Nose/Mouth/Throat:  no complaints of nasal congestion, no sore throat Cardiovascular:  no chest pain Respiratory:  no shortness of breath Gastrointestinal:  no abdominal pain, no change in bowel habits GU:  Male: negative for dysuria Musculoskeletal/Extremities:  no pain of the joints Integumentary (Skin/Breast):  no abnormal skin lesions reported Neurologic:  no headaches Endocrine: No unexpected weight changes Hematologic/Lymphatic:  no night sweats  Exam BP 102/72   Pulse (!) 57   Temp 97.9 F (36.6 C) (Oral)   Ht 5\' 11"  (1.803 m)   Wt 170 lb (77.1 kg)   SpO2 99%   BMI 23.71 kg/m  General:  well developed, well nourished, in no apparent distress Skin:  no significant moles, warts, or growths Head:  no masses, lesions, or tenderness Eyes:  pupils equal and round, sclera anicteric without injection Ears:  canals without lesions, TMs shiny without  retraction, no obvious effusion, no erythema Nose:  nares patent, septum midline, mucosa normal Throat/Pharynx:  lips and gingiva without lesion; tongue and uvula midline; non-inflamed pharynx; no exudates or postnasal drainage Neck: neck supple without adenopathy, thyromegaly, or masses Lungs:  clear to auscultation, breath sounds equal bilaterally, no respiratory distress Cardio:  regular rate and rhythm, no bruits, no LE edema Abdomen:  abdomen soft, nontender; bowel sounds normal; no masses or organomegaly Genital (male): Deferred Rectal: Deferred Musculoskeletal:  symmetrical muscle groups noted without atrophy or deformity Extremities:  no clubbing, cyanosis, or edema, no deformities, no skin discoloration Neuro:  gait normal; deep tendon reflexes normal and symmetric Psych: well oriented with normal range of affect and appropriate judgment/insight  Assessment and Plan  Well adult exam - Plan: CBC, Comprehensive metabolic panel, Lipid panel   Well 35 y.o. male. Counseled on diet and exercise. Self testicular exams recommended at least monthly.  Flu shot today. Bivalent covid booster rec'd. Will come back for fasting labs.  Other orders as above. Follow up in 1 year pending the above workup. The patient voiced understanding and agreement to the plan.  32 [place]ummer, DO 07/01/21 2:51 PM

## 2021-07-01 NOTE — Addendum Note (Signed)
Addended by: Scharlene Gloss B on: 07/01/2021 03:03 PM   Modules accepted: Orders

## 2021-07-01 NOTE — Patient Instructions (Addendum)
Give [place] 2-3 business days to get the results of your labs back.   Keep the diet clean and stay active.  Aim to do some physical exertion for 150 minutes per week. This is typically divided into 5 days per week, 30 minutes per day. The activity should be enough to get your heart rate up. Anything is better than nothing if you have time constraints.  Do monthly self testicular checks in the shower. You are feeling for lumps/bumps that don't belong. If you feel anything like this, let me know!  I recommend getting the updated bivalent covid vaccination booster at your convenience.   Let us know if you need anything.

## 2021-07-03 ENCOUNTER — Other Ambulatory Visit (INDEPENDENT_AMBULATORY_CARE_PROVIDER_SITE_OTHER): Payer: Managed Care, Other (non HMO)

## 2021-07-03 DIAGNOSIS — Z0000 Encounter for general adult medical examination without abnormal findings: Secondary | ICD-10-CM

## 2021-07-03 LAB — COMPREHENSIVE METABOLIC PANEL
ALT: 33 U/L (ref 0–53)
AST: 21 U/L (ref 0–37)
Albumin: 4.9 g/dL (ref 3.5–5.2)
Alkaline Phosphatase: 68 U/L (ref 39–117)
BUN: 19 mg/dL (ref 6–23)
CO2: 31 mEq/L (ref 19–32)
Calcium: 10.3 mg/dL (ref 8.4–10.5)
Chloride: 103 mEq/L (ref 96–112)
Creatinine, Ser: 1.12 mg/dL (ref 0.40–1.50)
GFR: 86.39 mL/min (ref >60.00)
Glucose, Bld: 91 mg/dL (ref 70–99)
Potassium: 4.4 mEq/L (ref 3.5–5.1)
Sodium: 140 mEq/L (ref 135–145)
Total Bilirubin: 0.5 mg/dL (ref 0.2–1.2)
Total Protein: 7.6 g/dL (ref 6.0–8.3)

## 2021-07-03 LAB — CBC
HCT: 45 % (ref 39.0–52.0)
Hemoglobin: 15.4 g/dL (ref 13.0–17.0)
MCHC: 34.1 g/dL (ref 30.0–36.0)
MCV: 84.6 fl (ref 78.0–100.0)
Platelets: 225 10*3/uL (ref 150.0–400.0)
RBC: 5.32 Mil/uL (ref 4.22–5.81)
RDW: 12.9 % (ref 11.5–15.5)
WBC: 7.8 10*3/uL (ref 4.0–10.5)

## 2021-07-03 LAB — LIPID PANEL
Cholesterol: 192 mg/dL (ref 0–200)
HDL: 40 mg/dL (ref >39.00)
LDL Cholesterol: 130 mg/dL — ABNORMAL HIGH (ref 0–99)
NonHDL: 151.94
Total CHOL/HDL Ratio: 5
Triglycerides: 109 mg/dL (ref 0.0–149.0)
VLDL: 21.8 mg/dL (ref 0.0–40.0)

## 2022-07-05 ENCOUNTER — Ambulatory Visit (INDEPENDENT_AMBULATORY_CARE_PROVIDER_SITE_OTHER): Payer: Managed Care, Other (non HMO) | Admitting: Family Medicine

## 2022-07-05 ENCOUNTER — Encounter: Payer: Self-pay | Admitting: Family Medicine

## 2022-07-05 VITALS — BP 108/60 | HR 69 | Temp 98.0°F | Ht 71.0 in | Wt 166.4 lb

## 2022-07-05 DIAGNOSIS — Z0000 Encounter for general adult medical examination without abnormal findings: Secondary | ICD-10-CM | POA: Diagnosis not present

## 2022-07-05 LAB — LIPID PANEL
Cholesterol: 154 mg/dL (ref 0–200)
HDL: 31.4 mg/dL — ABNORMAL LOW (ref >39.00)
LDL Cholesterol: 101 mg/dL — ABNORMAL HIGH (ref 0–99)
NonHDL: 122.88
Total CHOL/HDL Ratio: 5
Triglycerides: 110 mg/dL (ref 0.0–149.0)
VLDL: 22 mg/dL (ref 0.0–40.0)

## 2022-07-05 LAB — COMPREHENSIVE METABOLIC PANEL
ALT: 37 U/L (ref 0–53)
AST: 24 U/L (ref 0–37)
Albumin: 4.6 g/dL (ref 3.5–5.2)
Alkaline Phosphatase: 68 U/L (ref 39–117)
BUN: 17 mg/dL (ref 6–23)
CO2: 28 mEq/L (ref 19–32)
Calcium: 9.4 mg/dL (ref 8.4–10.5)
Chloride: 105 mEq/L (ref 96–112)
Creatinine, Ser: 1.03 mg/dL (ref 0.40–1.50)
GFR: 94.86 mL/min (ref >60.00)
Glucose, Bld: 89 mg/dL (ref 70–99)
Potassium: 4.3 mEq/L (ref 3.5–5.1)
Sodium: 140 mEq/L (ref 135–145)
Total Bilirubin: 0.5 mg/dL (ref 0.2–1.2)
Total Protein: 7 g/dL (ref 6.0–8.3)

## 2022-07-05 LAB — CBC
HCT: 42.2 % (ref 39.0–52.0)
Hemoglobin: 14.9 g/dL (ref 13.0–17.0)
MCHC: 35.3 g/dL (ref 30.0–36.0)
MCV: 83.4 fl (ref 78.0–100.0)
Platelets: 226 10*3/uL (ref 150.0–400.0)
RBC: 5.06 Mil/uL (ref 4.22–5.81)
RDW: 12.7 % (ref 11.5–15.5)
WBC: 6.3 10*3/uL (ref 4.0–10.5)

## 2022-07-05 NOTE — Progress Notes (Signed)
Chief Complaint  Patient presents with   Annual Exam    Well Male Eric Barrera is here for a complete physical.   His last physical was >1 year ago.  Current diet: in general, a "healthy" diet.   Current exercise: cycling, lifting wts Weight trend: stable Fatigue out of ordinary? Yes- tired in AM.  Seat belt? Yes.   Advanced directive? No  Health maintenance Tetanus- Yes HIV- Yes Hep C- Yes  Past Medical History:  Diagnosis Date   Hyperlipidemia     Past Surgical History:  Procedure Laterality Date   CHOLECYSTECTOMY N/A 10/17/2020   Procedure: LAPAROSCOPIC CHOLECYSTECTOMY;  Surgeon: Fritzi Mandes, MD;  Location: [facility] OR;  Service: General;  Laterality: N/A;   WISDOM TOOTH EXTRACTION     Medications  Takes no meds routinely.    Allergies No Known Allergies  Family History Family History  Problem Relation Age of Onset   Hypertension Mother    Hyperlipidemia Father     Review of Systems: Constitutional: no fevers or chills Eye:  no recent significant change in vision Ear/Nose/Mouth/Throat:  Ears:  no hearing loss Nose/Mouth/Throat:  no complaints of nasal congestion, no sore throat Cardiovascular:  no chest pain Respiratory:  no shortness of breath Gastrointestinal:  no abdominal pain, no change in bowel habits GU:  Male: negative for dysuria Musculoskeletal/Extremities:  no pain of the joints Integumentary (Skin/Breast):  no abnormal skin lesions reported Neurologic:  no headaches Endocrine: No unexpected weight changes Hematologic/Lymphatic:  no night sweats  Exam BP 108/60 (BP Location: Right Arm, Patient Position: Sitting, Cuff Size: Normal)   Pulse 69   Temp 98 F (36.7 C) (Oral)   Ht 5\' 11"  (1.803 m)   Wt 166 lb 6 oz (75.5 kg)   SpO2 99%   BMI 23.20 kg/m  General:  well developed, well nourished, in no apparent distress Skin:  no significant moles, warts, or growths Head:  no masses, lesions, or tenderness Eyes:  pupils equal and round, sclera  anicteric without injection Ears:  canals without lesions, TMs shiny without retraction, no obvious effusion, no erythema Nose:  nares patent, mucosa normal Throat/Pharynx:  lips and gingiva without lesion; tongue and uvula midline; non-inflamed pharynx; no exudates or postnasal drainage Neck: neck supple without adenopathy, thyromegaly, or masses Lungs:  clear to auscultation, breath sounds equal bilaterally, no respiratory distress Cardio:  regular rate and rhythm, no bruits, no LE edema Abdomen:  abdomen soft, nontender; bowel sounds normal; no masses or organomegaly Genital (male): Deferred Rectal: Deferred Musculoskeletal:  symmetrical muscle groups noted without atrophy or deformity Extremities:  no clubbing, cyanosis, or edema, no deformities, no skin discoloration Neuro:  gait normal; deep tendon reflexes normal and symmetric Psych: well oriented with normal range of affect and appropriate judgment/insight  Assessment and Plan  Well adult exam - Plan: CBC, Comprehensive metabolic panel, Lipid panel   Well 36 y.o. male. Counseled on diet and exercise. Self testicular exams recommended at least monthly.  Advanced directive form provided today.  Try to get 7 hrs of sleep nightly.  Other orders as above. Follow up in 1 year pending the above workup. The patient voiced understanding and agreement to the plan.  20 Hoskins, DO 07/05/22 7:54 AM

## 2022-07-05 NOTE — Patient Instructions (Signed)
Give us 2-3 business days to get the results of your labs back.   Keep the diet clean and stay active.  Do monthly self testicular checks in the shower. You are feeling for lumps/bumps that don't belong. If you feel anything like this, let me know!  Please get me a copy of your advanced directive form at your convenience.   Let us know if you need anything.  

## 2023-06-20 ENCOUNTER — Ambulatory Visit: Payer: Managed Care, Other (non HMO) | Admitting: Family Medicine

## 2023-06-20 ENCOUNTER — Other Ambulatory Visit (HOSPITAL_BASED_OUTPATIENT_CLINIC_OR_DEPARTMENT_OTHER): Payer: Self-pay

## 2023-06-20 ENCOUNTER — Encounter: Payer: Self-pay | Admitting: Family Medicine

## 2023-06-20 VITALS — BP 124/78 | HR 65 | Temp 98.0°F | Resp 16 | Ht 71.0 in | Wt 164.4 lb

## 2023-06-20 DIAGNOSIS — J029 Acute pharyngitis, unspecified: Secondary | ICD-10-CM | POA: Diagnosis not present

## 2023-06-20 LAB — POCT RAPID STREP A (OFFICE): Rapid Strep A Screen: NEGATIVE

## 2023-06-20 MED ORDER — VALACYCLOVIR HCL 1 G PO TABS
1000.0000 mg | ORAL_TABLET | Freq: Two times a day (BID) | ORAL | 0 refills | Status: AC
  Filled 2023-06-20: qty 14, 7d supply, fill #0

## 2023-06-20 NOTE — Progress Notes (Signed)
SUBJECTIVE:   Eric Barrera is a 37 y.o. male presents to the clinic for:  Chief Complaint  Patient presents with   Sore Throat    Sore Throat    Complains of sore throat for 3 months.  Other associated symptoms: comes and goes Stays for around 3 d.  Denies: sinus congestion, sinus pain, rhinorrhea, ear pain, ear drainage, wheezing, shortness of breath, myalgia, and fevers, difficulty swallowing Sick Contacts: none known Therapy to date:  NSAIDs, Tylenol  Social History   Tobacco Use  Smoking Status Never  Smokeless Tobacco Never    Patient's medications, allergies, past medical, surgical, social and family histories were reviewed and updated as appropriate.  OBJECTIVE:  BP 124/78 (BP Location: Left Arm, Patient Position: Sitting, Cuff Size: Normal)   Pulse 65   Temp 98 F (36.7 C) (Oral)   Resp 16   Ht 5\' 11"  (1.803 m)   Wt 164 lb 6.4 oz (74.6 kg)   SpO2 98%   BMI 22.93 kg/m  General: Awake, alert, appearing stated age Eyes: conjunctivae and sclerae clear Ears: normal TMs bilaterally Nose: no visible exudate Oropharynx: lips, mucosa, and tongue normal; teeth and gums normal, no pharyngeal exudate or erythema Neck: supple, no significant adenopathy Lungs: clear to auscultation, no wheezes, rales or rhonchi, symmetric air entry, normal effort Heart: RRR Skin:reveals no rash Psych: Age appropriate judgment and insight  ASSESSMENT/PLAN:  Sore throat - Plan: Herpes simplex virus culture, Culture, Group A Strep, Ambulatory referral to ENT  Continue to practice good hand hygiene and push fluids. Given duration and location, will refer to ENT.  Ibuprofen and acetaminophen for pain. Trial Valtrex when s/s's return.  HSV and GAS cx.  F/u prn. Pt voiced understanding and agreement to the plan.  Jilda Roche [place], DO 06/20/23 8:39 AM

## 2023-06-20 NOTE — Patient Instructions (Addendum)
If you do not hear anything about your referral in the next 1-2 weeks, call our office and ask for an update.  Ok to continue ibuprofen/Tylenol as needed.  We will be in touch with your results in the next week or so.   Let us know if you need anything.

## 2023-06-20 NOTE — Addendum Note (Signed)
Addended by: Kathi Ludwig on: 06/20/2023 08:49 AM   Modules accepted: Orders

## 2023-06-22 LAB — CULTURE, GROUP A STREP
Micro Number: 15743596
SPECIMEN QUALITY:: ADEQUATE

## 2023-06-22 LAB — HERPES SIMPLEX VIRUS CULTURE
MICRO NUMBER:: 15743578
SPECIMEN QUALITY:: ADEQUATE

## 2023-06-23 ENCOUNTER — Encounter (INDEPENDENT_AMBULATORY_CARE_PROVIDER_SITE_OTHER): Payer: Self-pay | Admitting: Otolaryngology

## 2023-07-09 ENCOUNTER — Encounter: Payer: Self-pay | Admitting: Family Medicine

## 2023-07-11 ENCOUNTER — Other Ambulatory Visit (HOSPITAL_BASED_OUTPATIENT_CLINIC_OR_DEPARTMENT_OTHER): Payer: Self-pay

## 2023-07-11 ENCOUNTER — Encounter: Payer: Self-pay | Admitting: Family Medicine

## 2023-07-11 ENCOUNTER — Ambulatory Visit (INDEPENDENT_AMBULATORY_CARE_PROVIDER_SITE_OTHER): Payer: Managed Care, Other (non HMO) | Admitting: Family Medicine

## 2023-07-11 VITALS — BP 110/68 | HR 63 | Temp 98.0°F | Resp 16 | Ht 71.0 in | Wt 166.8 lb

## 2023-07-11 DIAGNOSIS — Z0001 Encounter for general adult medical examination with abnormal findings: Secondary | ICD-10-CM

## 2023-07-11 DIAGNOSIS — L989 Disorder of the skin and subcutaneous tissue, unspecified: Secondary | ICD-10-CM

## 2023-07-11 DIAGNOSIS — Z0000 Encounter for general adult medical examination without abnormal findings: Secondary | ICD-10-CM

## 2023-07-11 MED ORDER — VALACYCLOVIR HCL 1 G PO TABS
ORAL_TABLET | ORAL | 1 refills | Status: AC
Start: 2023-07-11 — End: ?
  Filled 2023-07-11 – 2023-08-12 (×2): qty 30, 7d supply, fill #0

## 2023-07-11 NOTE — Progress Notes (Signed)
Chief Complaint  Patient presents with   Annual Exam    Annual Exam    Well Male Eric Barrera is here for a complete physical.   His last physical was >1 year ago.  Current diet: in general, a "healthy" diet.   Current exercise: cycling, strength training Weight trend: stable Fatigue out of ordinary? No. Seat belt? Yes.   Advanced directive? No  Health maintenance Tetanus- Yes HIV- Yes Hep C- Yes  Past Medical History:  Diagnosis Date   Hyperlipidemia      Past Surgical History:  Procedure Laterality Date   CHOLECYSTECTOMY N/A 10/17/2020   Procedure: LAPAROSCOPIC CHOLECYSTECTOMY;  Surgeon: Fritzi Mandes, MD;  Location: [facility] OR;  Service: General;  Laterality: N/A;   WISDOM TOOTH EXTRACTION     Medications  Takes no meds routinely.   Allergies No Known Allergies  Family History Family History  Problem Relation Age of Onset   Hypertension Mother    Hyperlipidemia Father    Review of Systems: Constitutional: no fevers or chills Eye:  no recent significant change in vision Ear/Nose/Mouth/Throat:  Ears:  no hearing loss Nose/Mouth/Throat:  no complaints of nasal congestion, no sore throat Cardiovascular:  no chest pain Respiratory:  no shortness of breath Gastrointestinal:  no abdominal pain, no change in bowel habits GU:  Male: negative for dysuria Musculoskeletal/Extremities:  no pain of the joints Integumentary (Skin/Breast): lesion on face that is growing; otherwise no abnormal skin lesions reported Neurologic:  no headaches Endocrine: No unexpected weight changes Hematologic/Lymphatic:  no night sweats  Exam BP 110/68 (BP Location: Left Arm, Patient Position: Sitting, Cuff Size: Normal)   Pulse 63   Temp 98 F (36.7 C) (Oral)   Resp 16   Ht 5\' 11"  (1.803 m)   Wt 166 lb 12.8 oz (75.7 kg)   SpO2 100%   BMI 23.26 kg/m  General:  well developed, well nourished, in no apparent distress Skin: see below; otherwise no significant moles, warts, or  growths Head:  no masses, lesions, or tenderness Eyes:  pupils equal and round, sclera anicteric without injection Ears:  canals without lesions, TMs shiny without retraction, no obvious effusion, no erythema Nose:  nares patent, mucosa normal Throat/Pharynx:  lips and gingiva without lesion; tongue and uvula midline; non-inflamed pharynx; no exudates or postnasal drainage Neck: neck supple without adenopathy, thyromegaly, or masses Lungs:  clear to auscultation, breath sounds equal bilaterally, no respiratory distress Cardio:  regular rate and rhythm, no bruits, no LE edema Abdomen:  abdomen soft, nontender; bowel sounds normal; no masses or organomegaly Genital (male): Deferred Rectal: Deferred Musculoskeletal:  symmetrical muscle groups noted without atrophy or deformity Extremities:  no clubbing, cyanosis, or edema, no deformities, no skin discoloration Neuro:  gait normal; deep tendon reflexes normal and symmetric Psych: well oriented with normal range of affect and appropriate judgment/insight   R portion of face  Assessment and Plan  Well adult exam - Plan: CBC, Comprehensive metabolic panel, Lipid panel  Well 37 y.o. male. Counseled on diet and exercise. Self testicular exams recommended at least monthly.  Looks like a verruca. Refer to derm at his request.  Advanced directive form provided today.  Other orders as above. Flu shot politely declined.  Follow up in 1 year pending the above workup. The patient voiced understanding and agreement to the plan.  Jilda Roche [place], DO 07/11/23 7:12 AM

## 2023-07-11 NOTE — Patient Instructions (Addendum)
Give us 2-3 business days to get the results of your labs back.   Keep the diet clean and stay active.  Please get me a copy of your advanced directive form at your convenience.   Do monthly self testicular checks in the shower. You are feeling for lumps/bumps that don't belong. If you feel anything like this, let me know!  If you do not hear anything about your referral in the next 1-2 weeks, call our office and ask for an update.  Let us know if you need anything.  

## 2023-07-13 ENCOUNTER — Other Ambulatory Visit (INDEPENDENT_AMBULATORY_CARE_PROVIDER_SITE_OTHER): Payer: Managed Care, Other (non HMO)

## 2023-07-13 DIAGNOSIS — Z0000 Encounter for general adult medical examination without abnormal findings: Secondary | ICD-10-CM | POA: Diagnosis not present

## 2023-07-13 LAB — LIPID PANEL
Cholesterol: 182 mg/dL (ref 0–200)
HDL: 35.4 mg/dL — ABNORMAL LOW (ref >39.00)
LDL Cholesterol: 132 mg/dL — ABNORMAL HIGH (ref 0–99)
NonHDL: 146.42
Total CHOL/HDL Ratio: 5
Triglycerides: 74 mg/dL (ref 0.0–149.0)
VLDL: 14.8 mg/dL (ref 0.0–40.0)

## 2023-07-13 LAB — CBC
HCT: 46.5 % (ref 39.0–52.0)
Hemoglobin: 16 g/dL (ref 13.0–17.0)
MCHC: 34.3 g/dL (ref 30.0–36.0)
MCV: 85.4 fL (ref 78.0–100.0)
Platelets: 249 10*3/uL (ref 150.0–400.0)
RBC: 5.45 Mil/uL (ref 4.22–5.81)
RDW: 13.1 % (ref 11.5–15.5)
WBC: 6.5 10*3/uL (ref 4.0–10.5)

## 2023-07-13 LAB — COMPREHENSIVE METABOLIC PANEL
ALT: 40 U/L (ref 0–53)
AST: 25 U/L (ref 0–37)
Albumin: 4.9 g/dL (ref 3.5–5.2)
Alkaline Phosphatase: 79 U/L (ref 39–117)
BUN: 18 mg/dL (ref 6–23)
CO2: 31 meq/L (ref 19–32)
Calcium: 10 mg/dL (ref 8.4–10.5)
Chloride: 101 meq/L (ref 96–112)
Creatinine, Ser: 1.07 mg/dL (ref 0.40–1.50)
GFR: 89.97 mL/min (ref >60.00)
Glucose, Bld: 89 mg/dL (ref 70–99)
Potassium: 4.4 meq/L (ref 3.5–5.1)
Sodium: 139 meq/L (ref 135–145)
Total Bilirubin: 0.7 mg/dL (ref 0.2–1.2)
Total Protein: 7.6 g/dL (ref 6.0–8.3)

## 2023-07-15 ENCOUNTER — Institutional Professional Consult (permissible substitution) (INDEPENDENT_AMBULATORY_CARE_PROVIDER_SITE_OTHER): Payer: Managed Care, Other (non HMO) | Admitting: Otolaryngology

## 2023-07-21 ENCOUNTER — Other Ambulatory Visit (HOSPITAL_BASED_OUTPATIENT_CLINIC_OR_DEPARTMENT_OTHER): Payer: Self-pay

## 2023-08-12 ENCOUNTER — Other Ambulatory Visit (HOSPITAL_BASED_OUTPATIENT_CLINIC_OR_DEPARTMENT_OTHER): Payer: Self-pay

## 2023-09-22 ENCOUNTER — Telehealth: Payer: Managed Care, Other (non HMO) | Admitting: Physician Assistant

## 2023-09-22 ENCOUNTER — Other Ambulatory Visit (HOSPITAL_BASED_OUTPATIENT_CLINIC_OR_DEPARTMENT_OTHER): Payer: Self-pay

## 2023-09-22 ENCOUNTER — Encounter: Payer: Self-pay | Admitting: Family Medicine

## 2023-09-22 DIAGNOSIS — J101 Influenza due to other identified influenza virus with other respiratory manifestations: Secondary | ICD-10-CM | POA: Diagnosis not present

## 2023-09-22 MED ORDER — OSELTAMIVIR PHOSPHATE 75 MG PO CAPS
75.0000 mg | ORAL_CAPSULE | Freq: Two times a day (BID) | ORAL | 0 refills | Status: DC
  Filled 2023-09-22: qty 10, 5d supply, fill #0

## 2023-09-22 NOTE — Progress Notes (Signed)
 E visit for Flu like symptoms   We are sorry that you are not feeling well.  Here is how we plan to help! Based on what you have shared with me it looks like you may have a respiratory virus that may be influenza.  Influenza or "the flu" is   an infection caused by a respiratory virus. The flu virus is highly contagious and persons who did not receive their yearly flu vaccination may "catch" the flu from close contact.  We have anti-viral medications to treat the viruses that cause this infection. They are not a "cure" and only shorten the course of the infection. These prescriptions are most effective when they are given within the first 2 days of "flu" symptoms. Antiviral medication are indicated if you have a high risk of complications from the flu. You should  also consider an antiviral medication if you are in close contact with someone who is at risk. These medications can help patients avoid complications from the flu  but have side effects that you should know. Possible side effects from Tamiflu or oseltamivir include nausea, vomiting, diarrhea, dizziness, headaches, eye redness, sleep problems or other respiratory symptoms. You should not take Tamiflu if you have an allergy to oseltamivir or any to the ingredients in Tamiflu.  Based upon your symptoms and potential risk factors I have prescribed Oseltamivir (Tamiflu).  It has been sent to your designated pharmacy.  You will take one 75 mg capsule orally twice a day for the next 5 days.  ANYONE WHO HAS FLU SYMPTOMS SHOULD: Stay home. The flu is highly contagious and going out or to work exposes others! Be sure to drink plenty of fluids. Water is fine as well as fruit juices, sodas and electrolyte beverages. You may want to stay away from caffeine or alcohol. If you are nauseated, try taking small sips of liquids. How do you know if you are getting enough fluid? Your urine should be a pale yellow or almost colorless. Get rest. Taking a steamy  shower or using a humidifier may help nasal congestion and ease sore throat pain. Using a saline nasal spray works much the same way. Cough drops, hard candies and sore throat lozenges may ease your cough. Line up a caregiver. Have someone check on you regularly.   GET HELP RIGHT AWAY IF: You cannot keep down liquids or your medications. You become short of breath Your fell like you are going to pass out or loose consciousness. Your symptoms persist after you have completed your treatment plan MAKE SURE YOU  Understand these instructions. Will watch your condition. Will get help right away if you are not doing well or get worse.  Your e-visit answers were reviewed by a board certified advanced clinical practitioner to complete your personal care plan.  Depending on the condition, your plan could have included both over the counter or prescription medications.  If there is a problem please reply  once you have received a response from your provider.  Your safety is important to [place].  If you have drug allergies check your prescription carefully.    You can use MyChart to ask questions about today's visit, request a non-urgent call back, or ask for a work or school excuse for 24 hours related to this e-Visit. If it has been greater than 24 hours you will need to follow up with your provider, or enter a new e-Visit to address those concerns.  You will get an e-mail in the next  two days asking about your experience.  I hope that your e-visit has been valuable and will speed your recovery. Thank you for using e-visits.  I have spent 5 minutes in review of e-visit questionnaire, review and updating patient chart, medical decision making and response to patient.   Margaretann Loveless, PA-C

## 2023-10-10 ENCOUNTER — Ambulatory Visit: Payer: Managed Care, Other (non HMO) | Admitting: Dermatology

## 2023-10-10 ENCOUNTER — Encounter: Payer: Self-pay | Admitting: Dermatology

## 2023-10-10 VITALS — BP 138/77

## 2023-10-10 DIAGNOSIS — D492 Neoplasm of unspecified behavior of bone, soft tissue, and skin: Secondary | ICD-10-CM

## 2023-10-10 DIAGNOSIS — D485 Neoplasm of uncertain behavior of skin: Secondary | ICD-10-CM

## 2023-10-10 DIAGNOSIS — D2239 Melanocytic nevi of other parts of face: Secondary | ICD-10-CM

## 2023-10-10 NOTE — Progress Notes (Signed)
   New Patient Visit   Subjective  Eric Barrera is a 38 y.o. male who presents for the following: spot on face.  No hx or family hx of skin cancer. Lesion has been present for at least 15 years but is growing and changing.  The patient has spots, moles and lesions to be evaluated, some may be new or changing.  The following portions of the chart were reviewed this encounter and updated as appropriate: medications, allergies, medical history  Review of Systems:  No other skin or systemic complaints except as noted in HPI or Assessment and Plan.  Objective  Well appearing patient in no apparent distress; mood and affect are within normal limits.  A focused examination was performed of the following areas:  Face  Relevant exam findings are noted in the Assessment and Plan.  Right Parotid Area 1.1 cm pink plaque   Assessment & Plan   NEOPLASM OF UNCERTAIN BEHAVIOR OF SKIN Right Parotid Area Skin / nail biopsy Type of biopsy: tangential   Informed consent: discussed and consent obtained   Timeout: patient name, date of birth, surgical site, and procedure verified   Procedure prep:  Patient was prepped and draped in usual sterile fashion Prep type:  Isopropyl alcohol Anesthesia: the lesion was anesthetized in a standard fashion   Anesthetic:  1% lidocaine w/ epinephrine 1-100,000 buffered w/ 8.4% NaHCO3 Instrument used: DermaBlade   Hemostasis achieved with: aluminum chloride   Outcome: patient tolerated procedure well   Post-procedure details: sterile dressing applied and wound care instructions given   Dressing type: petrolatum and bandage   Specimen 1 - Surgical pathology Differential Diagnosis: r/o irritated nevus vs NMSC  Check Margins: No  Return if symptoms worsen or fail to improve.  I, Manual Meier, Surg Tech III, am acting as scribe for Gwenith Daily, MD.   Documentation: I have reviewed the above documentation for accuracy and completeness, and I agree with  the above.  Gwenith Daily, MD

## 2023-10-10 NOTE — Patient Instructions (Signed)

## 2023-10-12 LAB — SURGICAL PATHOLOGY

## 2024-07-11 ENCOUNTER — Encounter: Payer: Managed Care, Other (non HMO) | Admitting: Family Medicine

## 2024-07-20 ENCOUNTER — Encounter: Payer: Self-pay | Admitting: Family Medicine

## 2024-07-20 ENCOUNTER — Ambulatory Visit: Admitting: Family Medicine

## 2024-07-20 VITALS — BP 122/74 | HR 72 | Temp 98.0°F | Resp 16 | Ht 71.0 in | Wt 168.2 lb

## 2024-07-20 DIAGNOSIS — Z0000 Encounter for general adult medical examination without abnormal findings: Secondary | ICD-10-CM

## 2024-07-20 NOTE — Progress Notes (Signed)
 Chief Complaint  Patient presents with   Annual Exam    CPE    Well Male Eric Barrera is here for a complete physical.   His last physical was >1 year ago.  Current diet: in general, a healthy diet.   Current exercise: limited lately  Weight trend: stable Fatigue out of ordinary? No. Seat belt? Yes.   Advanced directive? No  Health maintenance Tetanus- Yes HIV- Yes Hep C- Yes  Past Medical History:  Diagnosis Date   Hyperlipidemia      Past Surgical History:  Procedure Laterality Date   CHOLECYSTECTOMY N/A 10/17/2020   Procedure: LAPAROSCOPIC CHOLECYSTECTOMY;  Surgeon: Dasie Leonor CROME, MD;  Location: [facility] OR;  Service: General;  Laterality: N/A;   WISDOM TOOTH EXTRACTION      Medications  Medications Ordered Prior to Encounter[1]   Allergies Allergies[2]  Family History Family History  Problem Relation Age of Onset   Hypertension Mother    Hyperlipidemia Father     Review of Systems: Constitutional: no fevers or chills Eye:  no recent significant change in vision Ear/Nose/Mouth/Throat:  Ears:  no hearing loss Nose/Mouth/Throat:  no complaints of nasal congestion, no sore throat Cardiovascular:  no chest pain Respiratory:  no shortness of breath Gastrointestinal:  no abdominal pain, no change in bowel habits GU:  Male: negative for dysuria Musculoskeletal/Extremities: + Left inner elbow pain Integumentary (Skin/Breast):  no abnormal skin lesions reported Neurologic:  no headaches Endocrine: No unexpected weight changes Hematologic/Lymphatic:  no night sweats  Exam BP 122/74 (BP Location: Left Arm, Patient Position: Sitting)   Pulse 72   Temp 98 F (36.7 C) (Oral)   Resp 16   Ht 5' 11 (1.803 m)   Wt 168 lb 3.2 oz (76.3 kg)   SpO2 98%   BMI 23.46 kg/m  General:  well developed, well nourished, in no apparent distress Skin:  no significant moles, warts, or growths Head:  no masses, lesions, or tenderness Eyes:  pupils equal and round, sclera  anicteric without injection Ears:  canals without lesions, TMs shiny without retraction, no obvious effusion, no erythema Nose:  nares patent, mucosa normal Throat/Pharynx:  lips and gingiva without lesion; tongue and uvula midline; non-inflamed pharynx; no exudates or postnasal drainage Neck: neck supple without adenopathy, thyromegaly, or masses Lungs:  clear to auscultation, breath sounds equal bilaterally, no respiratory distress Cardio:  regular rate and rhythm, no bruits, no LE edema Abdomen:  abdomen soft, nontender; bowel sounds normal; no masses or organomegaly Genital (male): Deferred Rectal: Deferred Musculoskeletal: No TTP over the brachial radialis or biceps tendon; symmetrical muscle groups noted without atrophy or deformity Extremities:  no clubbing, cyanosis, or edema, no deformities, no skin discoloration Neuro:  gait normal; deep tendon reflexes normal and symmetric Psych: well oriented with normal range of affect and appropriate judgment/insight  Assessment and Plan  Well adult exam - Plan: CBC, Comprehensive metabolic panel with GFR, Lipid panel   Well 38 y.o. male. Counseled on diet and exercise. Self testicular exams recommended at least monthly.  Advanced directive form provided today.  Flu shot politely declined.  Stretches and exercises provided for the distal biceps tendon.  No improvement over the next month or so, he will let me know and we will consider either sports medicine or physical therapy. Other orders as above. Follow up in 1 year pending the above workup. The patient voiced understanding and agreement to the plan.  Mabel [place], DO 07/20/2024 1:34 PM     [1]  Current Outpatient Medications on File Prior to Visit  Medication Sig Dispense Refill   valACYclovir  (VALTREX ) 1000 MG tablet Take 2 tabs and repeat in 12 hours in event of an outbreak. 30 tablet 1   No current facility-administered medications on file prior to visit.  [2]  No Known Allergies

## 2024-07-20 NOTE — Patient Instructions (Addendum)
 Give us  2-3 business days to get the results of your labs back.   Keep the diet clean and stay active.  Do monthly self testicular checks in the shower. You are feeling for lumps/bumps that don't belong. If you feel anything like this, let me know!  Please get me a copy of your advanced directive form at your convenience.   Let us  know if you need anything.  Biceps Tendon Disruption (Distal) Rehab Ask your health care provider which exercises are safe for you. Do exercises exactly as told by your health care provider and adjust them as directed. It is normal to feel mild stretching, pulling, tightness, or discomfort as you do these exercises, but you should stop right away if you feel sudden pain or your pain gets worse. Do not begin these exercises until told by your health care provider. Stretching and range of motion exercises These exercises warm up your muscles and joints and improve the movement and flexibility of your arm. These exercises can also help to relieve pain, numbness, and tingling. Exercise A: Elbow flexion, passive  Stand or sit with your left / right arm at your side. Use your other hand to gently push your left / right hand toward your shoulder. Bend your elbow as far as your health care provider tells you to. You may be instructed to try to bend your elbow more and more over time. Hold for 10 seconds. Slowly return to the starting position. Repeat 2 times. Complete this exercise 3 times per week. Exercise B: Supination, passive Sit with your left / right elbow bent to an L shape (90 degrees) and your forearm resting on a table, palm-down. Keeping your upper body and shoulder still, use your other hand to rotate your left / right palm up. Stop when you feel a gentle to moderate stretch. Hold for 10 seconds. Slowly return to the starting position. Repeat 2 times. Complete this exercise 3 times per week. Exercise C: Pronation, passive  Sit with your left / right  elbow bent to an L shape (90 degrees) and your forearm resting on a table, palm-up. Keeping your upper body and shoulder still, use your other hand to rotate your left / right palm down. Stop when you feel a gentle to moderate stretch. Hold for 10 seconds. Slowly return to the starting position. Repeat 2 times. Complete this exercise 3 times per week. Exercise D: Elbow extension, active Start this exercise only when your health care provider tells you. Stand or sit with your left / right elbow bent and your palm facing in, toward your body. Slowly straighten your elbow using only your arm muscles. Stop when you feel a very slight stretch at the front of your arm, or when you reach the position where your health care provider tells you to stop. You may be instructed to try to straighten your arm more and more each week. Hold for 3 seconds. Slowly return to the starting position. Repeat 2 times. Complete this exercise 3 times per week. Exercise E: Elbow flexion, active Start this exercise only when your health care provider tells you. Stand or sit with your left / right elbow bent and your palm facing in, toward your body. Bend your elbow as far as you can using only your arm muscles. Hold for 3 seconds. Slowly return to the starting position. Repeat 2 times. Complete this exercise 3 times per week. Exercise F: Supination, active Start this exercise only when your health care provider tells  you. Stand or sit with your left / right elbow bent to an L shape (90 degrees). Rotate your palm up until you feel a gentle stretch on the inside of your forearm. Hold for 3 seconds. Slowly return to the starting position. Repeat 2 times. Complete this exercise 3 times per week. Exercise G: Pronation, active  Start this exercise only when your health care provider tells you. Stand or sit with your left / right elbow bent to an L shape (90 degrees). Rotate your palm down until you feel a gentle  stretch on the top of your forearm. Hold for 3 seconds. Slowly release and return to the starting position. Repeat 2 times. Complete this exercise 3 times per week. Exercise H: Elbow extension, passive, supine Lie on your back in a comfortable position that allows you to relax your arm muscles. Place a folded towel under your left / right upper arm so your elbow and shoulder are at the same height. Use your other arm to raise your left / right arm until your elbow does not rest on the bed or towel. Hold your left / right arm out straight with your other hand supporting it. Let the weight of your hand stretch the inside of your elbow. Keep your arm and chest muscles relaxed. If directed, you may hold a 5-10 weight in your hand to increase the intensity of the stretch. Hold for 3 seconds. Slowly release the stretch. Repeat 2 times. Complete this exercise 3 times per week Strengthening exercises These exercises build strength and endurance in your arm and shoulder. Endurance is the ability to use your muscles for a long time, even after your muscles get tired. Do not start any strengthening exercises until told by your health care provider. Exercise I: Elbow flexion, isometric  Stand or sit with your left / right arm at waist height. Your palm should face in, toward your body. Place your other hand on top of your left / right forearm. Gently push down while you resist with your left / right arm. Use about half (50%) effort with both arms. You may be instructed to use more and more effort with your arms each week. Try not to let your right / left elbow move. Hold for 3 seconds. Let your muscles relax completely before you repeat this exercise. Repeat 2 times. Complete this exercise 3 times per week. Exercise J: Elbow extension, isometric  Stand or sit with your left / right arm at waist height. Your palm should face in, toward your body. Place your other hand on the bottom of your left / right  forearm. Gently push up while you resist with your left / right arm. Use about half (50%) effort with both arms. You may be instructed to use more and more effort with your arms each week. Try not to let your left / right elbow move. Hold for 3 seconds. Let your muscles relax completely before you repeat this exercise. Repeat 2 times. Complete this exercise 3 times per week. Exercise K: Biceps curls ( elbow flexion, supinated) Sit on a stable chair without armrests, or stand. Hold a 5-10 lb weight in your left / right hand. Your palm should face out, away from your body, at the starting position. Bend your left / right elbow and move your hand up toward your shoulder. Slowly return to the starting position. Repeat 2 times. Complete this exercise 3 times per week. Exercise L: Hammer curls ( elbow flexion, neutral forearm) Sit on  a stable chair without armrests, or stand. Hold a 5-10 lb weight in your left / right hand, or hold an exercise band with both hands. Your palms should face each other at the starting position. Bend your left / right elbow and move your hand up toward your shoulder. Keep your other arm straight. Slowly return to the starting position. Repeat 2 times. Complete this exercise 3 times per week. Exercise M: Triceps curls ( elbow extension) Lie on your back. Hold a 5-10 lb weight in your left / right hand. Bend your left / right elbow to an L shape (90 degrees) so the weight is in front of your face, over your chest, and your elbow is pointed up to the ceiling. Straighten your elbow, raising your hand toward the ceiling. Use your other hand to support your left / right upper arm. Slowly return to the starting position. Repeat 2 times. Complete this exercise 3 times per week. Exercise N: Elbow extension with exercise band  Sit on a stable chair without armrests, or stand. Hold an exercise band in both hands. Keeping your upper arms at your side, bring both hands up  to your shoulder. Keep your left / right hand just below your other hand. Straighten your left / right elbow while keeping your other arm still. Hold for 3 seconds. Slowly bend your elbow to return to the starting position. Repeat 2 times. Complete this exercise 3 times per week. Exercise O: Supination  Sit with the your left / right forearm supported on a table. Your elbow should be at waist height. Rest your hand over the edge of the table, palm-down. Gently grasp a lightweight hammer. Without moving your left / right elbow, slowly rotate your palm up, to a thumbs-up position. Hold for 3 seconds. Slowly return to the starting position. Repeat 2 times. Complete this exercise 3 times per weeky. Exercise P: Pronation  Sit with your left / right forearm supported on a table. Your elbow should be at waist height. Rest your hand over the edge of the table, palm-up. Gently grasp a lightweight hammer. Without moving your left / right elbow, slowly rotate your palm down. Hold for 3 seconds. Slowly return to the starting position. Repeat 2 times. Complete this exercise 3 times per week. This information is not intended to replace advice given to you by your health care provider. Make sure you discuss any questions you have with your health care provider. Document Released: 07/19/2005 Document Revised: 03/25/2016 Document Reviewed: 04/13/2015 [employer] Interactive Patient Education  [employer].

## 2024-07-23 ENCOUNTER — Other Ambulatory Visit

## 2025-07-23 ENCOUNTER — Encounter: Admitting: Family Medicine
